# Patient Record
Sex: Female | Born: 1955
Health system: Southern US, Community
[De-identification: ages and names within clinical notes are randomized; demographics above are authoritative.]

## PROBLEM LIST (undated history)

## (undated) DIAGNOSIS — I10 Essential (primary) hypertension: Secondary | ICD-10-CM

## (undated) DIAGNOSIS — G629 Polyneuropathy, unspecified: Secondary | ICD-10-CM

## (undated) DIAGNOSIS — E7800 Pure hypercholesterolemia, unspecified: Secondary | ICD-10-CM

## (undated) DIAGNOSIS — M797 Fibromyalgia: Secondary | ICD-10-CM

## (undated) DIAGNOSIS — K219 Gastro-esophageal reflux disease without esophagitis: Secondary | ICD-10-CM

## (undated) DIAGNOSIS — I639 Cerebral infarction, unspecified: Secondary | ICD-10-CM

## (undated) DIAGNOSIS — F419 Anxiety disorder, unspecified: Secondary | ICD-10-CM

## (undated) DIAGNOSIS — E119 Type 2 diabetes mellitus without complications: Secondary | ICD-10-CM

## (undated) DIAGNOSIS — E039 Hypothyroidism, unspecified: Secondary | ICD-10-CM

## (undated) HISTORY — PX: ABDOMINAL HYSTERECTOMY: SHX81

## (undated) HISTORY — DX: Anxiety disorder, unspecified: F41.9

## (undated) HISTORY — DX: Type 2 diabetes mellitus without complications: E11.9

## (undated) HISTORY — PX: APPENDECTOMY: SHX54

## (undated) HISTORY — DX: Fibromyalgia: M79.7

## (undated) HISTORY — PX: TUBAL LIGATION: SHX77

## (undated) HISTORY — DX: Cerebral infarction, unspecified: I63.9

## (undated) HISTORY — PX: TONSILLECTOMY: SUR1361

## (undated) HISTORY — PX: CHOLECYSTECTOMY: SHX55

## (undated) HISTORY — DX: Hypothyroidism, unspecified: E03.9

---

## 2005-12-01 ENCOUNTER — Ambulatory Visit: Payer: Self-pay | Admitting: Endocrinology

## 2006-05-04 ENCOUNTER — Ambulatory Visit: Payer: Self-pay | Admitting: Endocrinology

## 2006-06-03 ENCOUNTER — Ambulatory Visit: Payer: Self-pay | Admitting: Endocrinology

## 2006-06-03 LAB — CONVERTED CEMR LAB: TSH: 5.23 microintl units/mL (ref 0.35–5.50)

## 2006-10-21 ENCOUNTER — Encounter: Payer: Self-pay | Admitting: Endocrinology

## 2006-10-21 DIAGNOSIS — F411 Generalized anxiety disorder: Secondary | ICD-10-CM | POA: Insufficient documentation

## 2006-10-21 DIAGNOSIS — F329 Major depressive disorder, single episode, unspecified: Secondary | ICD-10-CM | POA: Insufficient documentation

## 2006-10-21 DIAGNOSIS — I10 Essential (primary) hypertension: Secondary | ICD-10-CM | POA: Insufficient documentation

## 2006-11-14 ENCOUNTER — Encounter: Payer: Self-pay | Admitting: Endocrinology

## 2006-11-14 ENCOUNTER — Ambulatory Visit: Payer: Self-pay | Admitting: Endocrinology

## 2006-11-14 LAB — CONVERTED CEMR LAB
Hgb A1c MFr Bld: 6 % (ref 4.6–6.0)
TSH: 2.6 microintl units/mL (ref 0.35–5.50)

## 2006-12-06 ENCOUNTER — Ambulatory Visit: Payer: Self-pay | Admitting: Cardiology

## 2006-12-16 ENCOUNTER — Ambulatory Visit: Payer: Self-pay | Admitting: Cardiology

## 2007-01-03 ENCOUNTER — Ambulatory Visit: Payer: Self-pay | Admitting: Cardiology

## 2007-01-06 ENCOUNTER — Ambulatory Visit: Payer: Self-pay | Admitting: Cardiology

## 2007-01-06 ENCOUNTER — Inpatient Hospital Stay (HOSPITAL_BASED_OUTPATIENT_CLINIC_OR_DEPARTMENT_OTHER): Admission: RE | Admit: 2007-01-06 | Discharge: 2007-01-06 | Payer: Self-pay | Admitting: Cardiology

## 2007-01-23 ENCOUNTER — Ambulatory Visit: Payer: Self-pay | Admitting: Cardiology

## 2008-01-25 ENCOUNTER — Ambulatory Visit: Payer: Self-pay | Admitting: Endocrinology

## 2008-01-25 DIAGNOSIS — E7800 Pure hypercholesterolemia, unspecified: Secondary | ICD-10-CM | POA: Insufficient documentation

## 2008-01-25 DIAGNOSIS — IMO0001 Reserved for inherently not codable concepts without codable children: Secondary | ICD-10-CM | POA: Insufficient documentation

## 2008-01-25 DIAGNOSIS — R7309 Other abnormal glucose: Secondary | ICD-10-CM | POA: Insufficient documentation

## 2008-01-25 DIAGNOSIS — E890 Postprocedural hypothyroidism: Secondary | ICD-10-CM | POA: Insufficient documentation

## 2008-01-25 LAB — CONVERTED CEMR LAB
Hgb A1c MFr Bld: 7.7 % — ABNORMAL HIGH (ref 4.6–6.0)
TSH: 6.61 microintl units/mL — ABNORMAL HIGH (ref 0.35–5.50)

## 2008-01-29 ENCOUNTER — Telehealth (INDEPENDENT_AMBULATORY_CARE_PROVIDER_SITE_OTHER): Payer: Self-pay | Admitting: *Deleted

## 2008-02-02 ENCOUNTER — Telehealth (INDEPENDENT_AMBULATORY_CARE_PROVIDER_SITE_OTHER): Payer: Self-pay | Admitting: *Deleted

## 2008-07-30 ENCOUNTER — Ambulatory Visit (HOSPITAL_BASED_OUTPATIENT_CLINIC_OR_DEPARTMENT_OTHER): Admission: RE | Admit: 2008-07-30 | Discharge: 2008-07-30 | Payer: Self-pay | Admitting: Orthopedic Surgery

## 2010-04-07 NOTE — Assessment & Plan Note (Signed)
   Vital Signs:  Patient Profile:   55 Years Old Female Weight:      183 pounds Temp:     96.9 degrees F Pulse rate:   81 / minute BP sitting:   119 / 85                 Visit Type:  f/u Referred by:  hasanj PCP:  hasanj  Chief Complaint:  hypothyroid.  History of Present Illness: hypothyroidism since i-131 rx 2006.  on synthroid 125/day, feels tired pt also as h/o hyperglycemia--working hard on her diet  Current Allergies: No known allergies   Past Medical History:    Anxiety    Depression    Hypertension    Hypothyroidism due to 131-i 2006    Dyslipidemia    Migraine    Menopause     Review of Systems  The patient denies weight loss.         few lbs   Physical Exam  General:     cooperative to examination and overweight-appearing.   Neck:     no thyromegaly.   Skin:     no edema.   Additional Exam:     tsh=2.6 a1c=6.0    Impression & Recommendations: well-controlled hypothyroidism fatigue of non-thyroidal etiology hyperglycemia or dm  Complete Medication List: 1)  Sertraline Hcl 50 Mg Tabs (Sertraline hcl) .... Take 1 by mouth qd 2)  Zanaflex 4 Mg Caps (Tizanidine hcl) .... Take 1 by mouth qhs 3)  Bisoprolol-hydrochlorothiazide 2.5-6.25 Mg Tabs (Bisoprolol-hydrochlorothiazide) .... Take 1 by mouth qd 4)  Enalapril Maleate 5 Mg Tabs (Enalapril maleate) .... Take 1 by mouth qd 5)  Frova 2.5 Mg Tabs (Frovatriptan succinate) .... Take 1 by mouth once daily prn 6)  Metoclopramide Hcl 10 Mg Tabs (Metoclopramide hcl) .... Take 1 by mouth qid 7)  Alprazolam 0.5 Mg Tabs (Alprazolam) .... Take 1 by mouth three times a day qd 8)  Topamax 50 Mg Tabs (Topiramate) .... Take 1 by mouth two times a day qd 9)  Vitamin B-12 Cr 1000 Mcg Tbcr (Cyanocobalamin) .... Take 1injection q month 10)  Levothyroxine Sodium 125 Mcg Tabs (levothyroxine Sodium)  .... Take 1 by mouth qd 11)  Premarin 1.25 Mg Tabs (Estrogens conjugated) .... Take 1 by mouth qd 12)   Crestor 10 Mg Tabs (Rosuvastatin calcium) .... Take 1 by mouth qd 13)  Nexium 40 Mg Cpdr (Esomeprazole magnesium) .... Take 1 by mouth qd 14)  Phentermine Hcl 37.5 Mg Tabs (Phentermine hcl) .... Take 1 by mouth qd 15)  Duoneb 2.5-0.5 Mg/44ml Soln (Albuterol-ipratropium) .... Take 1 q 4 hours prn   Patient Instructions: 1)  same synthroid (125/d) 2)  redouble diet efforts 3)  ret 6 mos

## 2010-04-07 NOTE — Progress Notes (Signed)
  Phone Note Call from Patient Call back at Home Phone [phone]   Caller: Patient Call For: dr ellsion Summary of Call: per pt call heard  her lab results on phone tree dr ellsion wanted  the name of her pharmacy pt uses [facility]* (retail) [address] [place], [place]  [address] Ph: [phone] Fax: [phone] Initial call taken by: Shelbie Proctor,  January 29, 2008 9:41 AM  Follow-up for Phone Call        Sent new sythroid to pharm. Pls let pt know. Follow-up by: Orlan Leavens,  January 29, 2008 9:55 AM  Additional Follow-up for Phone Call Additional follow up Details #1::        called pt to inform pt made aware rx was sent  Additional Follow-up by: Shelbie Proctor,  January 29, 2008 11:03 AM    New/Updated Medications: LEVOTHYROXINE SODIUM 150 MCG TABS (LEVOTHYROXINE SODIUM) take 51m by mouth qd   Prescriptions: LEVOTHYROXINE SODIUM 150 MCG TABS (LEVOTHYROXINE SODIUM) take 9m by mouth qd  #30 x 6   Entered by:   Orlan Leavens   Authorized by:   Minus Breeding MD   Signed by:   Orlan Leavens on 01/29/2008   Method used:   Electronically to        [facility]* (retail)       [address]       [place], [place]  [address]       Ph: [phone]       Fax: [phone]   RxID:   [id number]

## 2010-04-07 NOTE — Progress Notes (Signed)
Summary: phone tree  Phone Note Call from Patient Call back at Home Phone [phone]   Summary of Call: pt called stated on the phone tree you said you were going to give her something for her sugar and something for her thyroid but only 1 medication was sent to the pharmacy.....so pt is wondering did you want her to be taking second med as you stated on phone tree message Initial call taken by: Windell Norfolk,  February 02, 2008 1:52 PM  Follow-up for Phone Call        today i sent metformin, for sugar Follow-up by: Minus Breeding MD,  February 04, 2008 10:45 AM  Additional Follow-up for Phone Call Additional follow up Details #1::        called pt to inform pt made aware Additional Follow-up by: Shelbie Proctor,  February 05, 2008 8:34 AM      Prescriptions: METFORMIN HCL 500 MG XR24H-TAB (METFORMIN HCL) qam  #30 x 11   Entered and Authorized by:   Minus Breeding MD   Signed by:   Minus Breeding MD on 02/04/2008   Method used:   Electronically to        [facility]* (retail)       [address]       [place], [place]  [address]       Ph: [phone]       Fax: [phone]   RxID:   [id number]

## 2010-06-16 LAB — BASIC METABOLIC PANEL
BUN: 4 mg/dL — ABNORMAL LOW (ref 6–23)
CO2: 27 mEq/L (ref 19–32)
Calcium: 9.1 mg/dL (ref 8.4–10.5)
Chloride: 105 mEq/L (ref 96–112)
Creatinine, Ser: 0.53 mg/dL (ref 0.4–1.2)
GFR calc Af Amer: >60 mL/min (ref >60)
GFR calc non Af Amer: >60 mL/min (ref >60)
Glucose, Bld: 119 mg/dL — ABNORMAL HIGH (ref 70–99)
Potassium: 4.4 mEq/L (ref 3.5–5.1)
Sodium: 138 mEq/L (ref 135–145)

## 2010-06-16 LAB — GLUCOSE, CAPILLARY: Glucose-Capillary: 146 mg/dL — ABNORMAL HIGH (ref 70–99)

## 2010-06-16 LAB — POCT HEMOGLOBIN-HEMACUE: Hemoglobin: 14 g/dL (ref 12.0–15.0)

## 2010-07-21 NOTE — Assessment & Plan Note (Signed)
[facility]                          [facility] OFFICE NOTE   Amber, Hawkins                         MRN:          [id number]  DATE:01/03/2007                            DOB:          1956/02/16    PRIMARY CARDIOLOGIST:  Amber Codding, MD   REASON FOR VISIT:  Post-hospital follow-up.   Amber Hawkins presents to the clinic for post-hospital follow-up after she  was referred to [place] in early October for evaluation of chest pain.  She  presented with no prior history of heart disease, but did have several  cardiac risk factors, and presented with symptoms which we felt were  atypical.  Serial cardiac markers were Hawkins within normal limits.  She  was thus referred for extensive noninvasive workup consisting of 2-D  echo, adenosine stress Cardiolite, as well as carotid Dopplers.  Both  the echo and the adenosine stress Cardiolite were within normal limits.  Carotid Dopplers were for further assessment of a right carotid bruit;  however, the Doppler study suggested less than 50% bilateral ICA  stenosis.   Blood work was notable for an elevated TSH of 10.25.  The patient does  have treated hypothyroidism and is followed by Amber Signs A. Everardo All, MD, in  [place].   After the initial hospitalization, the patient was then briefly  hospitalized 3 weeks later with renal colic.  CT scan did show evidence  of a left ureterovesical junction stone and the patient apparently did  pass a kidney stone.  She is scheduled to follow up with Amber Hawkins.   The patient was also discharged with arrangements for a follow-up  CardioNet monitor for further evaluation of palpitations.  This was  placed several weeks ago and thus far yields evidence only of normal  sinus rhythm with no obvious dysrhythmia.   Clinically, the patient remains a somewhat vague historian.  She does  not describe any frank chest pain and also notes no significant change  in the frequency of her  palpitations.  Of note, we adjusted her  medications with discontinuation of bisoprolol/HCT, placing her only on  pure bisoprolol.   PAST MEDICAL HISTORY:  1. Hypertension.  2. Hyperlipidemia.  3. Hypothyroidism.  4. Gastroesophageal reflux disease.  5. Depression.  6. History of shingles.  7. Status post cholecystectomy.  8. Status post appendectomy.   CURRENT MEDICATIONS:  1. Aspirin 81 mg daily.  2. Bisoprolol 5 mg daily.  3. Nexium 40 mg daily.  4. Avapro 5 mg daily.  5. Synthroid 0.15 mg daily.  6. Topamax 200 mg b.i.d.  7. Crestor 10 mg daily.  8. Xanax 0.5 mg t.i.d.  9. Tizanide 4 mg q.h.s.  10.Sertraline 50 mg daily.  11.Metoclopramide 10 mg q.i.d.  12.Fenofibrate 160 mg daily.   ALLERGIES:  No known drug allergies.   SOCIAL HISTORY:  The patient lives in [place] with her husband.  She  has one child.  She is unemployed.  She has not smoked in 3 years but  previously smoked up to 2 packs a day since the age of 32.  She drinks  alcohol only on rare occasions.   FAMILY HISTORY:  Her father died at age 41 of complications of fatal  myocardial infarction.  He, significantly, reportedly had an MI in his  30s.  Mother is 39, no known heart disease.  The patient has one sister,  who has hypertension.   REVIEW OF SYSTEMS:  The patient denies a history of myocardial  infarction, congestive heart failure, or stroke.  Denies a history of  diabetes mellitus.  She has occasional exertional angina pectoris,  dyspnea, orthopnea, PND, and lower extremity edema.  She also has  symptoms of reflux disease.  Otherwise as per HPI.  Remaining systems  negative.   Electrocardiogram today reveals NSR at 78 BPM with chronic right bundle  branch block.   PHYSICAL EXAMINATION:  Blood pressure 100/74, pulse 88, regular, weight  186.4.  GENERAL:  A 55 year old female, morbidly obese, sitting upright in no  distress.  HEENT:  Normocephalic, atraumatic.  NECK:  Palpable bilateral  carotid pulses.  Right carotid bruit, no bruit  on the left.  No JVD.  LUNGS:  Clear to auscultation in Hawkins fields.  HEART:  Regular rate and rhythm, positive S1-S2.  No murmurs, rubs or  gallops.  ABDOMEN:  Protuberant, nontender.  EXTREMITIES:  No significant edema.  NEUROLOGIC:  Flat affect but no focal deficit.   IMPRESSION:  1. Atypical chest pain.      a.     Recent extensive workup with normal adenosine Cardiolite and       normal 2-D echo.  2. Palpitations.      a.     No dysrhythmia by CardioNet monitoring.  3. Treated hypothyroidism.      a.     Mildly elevated TSH of 10.3.  4. Hypotension.  5. Right bundle branch block.  6. History of tobacco.  7. Right carotid bruit.      a.     No significant internal carotid artery stenosis by recent       carotid Dopplers.   PLAN:  1. Complete full 3-week evaluation with CardioNet monitoring.  2. Recommend discontinuation of ACE inhibitor in light of current      hypotension, and given that the patient has normal left ventricular      function and no evidence of diabetes mellitus.  She is also on      bisoprolol, which we recently placed her on, both for treatment of      hypertension as well as palpitations.  3. Upon further review with Dr. Andee Hawkins, and in compliance with the      wishes of both the patient and her husband who is quite concerned      about her ongoing symptoms, the recommendation is to proceed with a      diagnostic coronary angiogram.  This is to further evaluate poor      exercise tolerance in light of her recent stress test, during which      she was initially asked to walk but apparently was then unable to      complete this.  The patient is agreeable with this plan and the      risks/benefits were discussed, in conjunction with Dr. Andee Hawkins.      Amber Serpe, PA-C  Electronically Signed      Amber Codding, MD,FACC  Electronically Signed   GS/MedQ  DD: 01/03/2007  DT: 01/04/2007  Job #: [id number]    cc:   Amber Hawkins

## 2010-07-21 NOTE — Op Note (Signed)
NAMERICKY, DOAN                ACCOUNT NO.:  [id number]   MEDICAL RECORD NO.:  [id number]          PATIENT TYPE:  AMB   LOCATION:  DSC                          FACILITY:  [facility]   PHYSICIAN:  Katy Fitch. Sypher, M.D. DATE OF BIRTH:  1955/05/31   DATE OF PROCEDURE:  07/30/2008  DATE OF DISCHARGE:                               OPERATIVE REPORT   PREOPERATIVE DIAGNOSIS:  Severe McGowan grade 3 ulnar neuropathy at left  elbow with positive electrodiagnostic studies documenting significant  ulnar neuropathy.   POSTOPERATIVE DIAGNOSIS:  Severe McGowan grade 3 ulnar neuropathy at  left elbow with positive electrodiagnostic studies documenting  significant ulnar neuropathy with identification of severe compression  of ulnar nerve at arcade of Struthers proximal to cubital tunnel.   OPERATION:  In situ decompression of ulnar nerve from arcade of  Struthers distally through the cubital tunnel, 6 cm distal to the  epicondyle.   SURGEON:  Katy Fitch. Sypher, MD   ASSISTANT:  Annye Rusk, PA-C   ANESTHESIA:  General by LMA.   SUPERVISING ANESTHESIOLOGIST:  Bedelia Person, MD   INDICATIONS:  Amber Hawkins is a 55 year old woman referred through the  courtesy of Dr. Doreen Beam of [place], [place], for evaluation and  management of left hand weakness and numbness.   Clinical examination revealed signs of severe left ulnar neuropathy with  intrinsic atrophy, a positive Froment sign, weak intrinsic function.  Electrodiagnostic studies were obtained at the office confirming very  severe left ulnar neuropathy in the elbow region.   We advised Amber Hawkins to proceed with exploration of the ulnar nerve  anticipating possible transposition.  We pointed out that this is an  orderly decompression surgery that will evaluate each of the possible  compression sites and treated accordingly.   If she is noted to have an unstable ulnar nerve, anterior transposition  would be indicated.   After  informed consent, she was brought to the operating room at this  time.   Preoperatively, she was advised that would take anywhere from several  months to as long as 20 months to see the full benefits of surgery.  Given her background history of multiple medical problems, she may have  slower healing than a more healthy individual   Questions were invited and answered in detail.   PROCEDURE:  Amber Hawkins was brought to the operating room and placed in  supine position upon on the table.   Following the induction of general anesthesia by LMA technique, the left  arm was prepped with Betadine soap solution and sterilely draped.  A  pneumatic tourniquet was applied to the proximal left brachium.   Following exsanguination of left arm with Esmarch bandage, the arterial  tourniquet was inflated to 220 mmHg.   Procedure commenced with a 2-cm incision directly over the ulnar nerve  posterior to the epicondyle.  Subcutaneous tissues were carefully  divided revealing considerable adipose tissue.  The anatomic landmarks  were identified followed by isolation of the ulnar nerve behind the  epicondyle.  The arcuate ligament was released followed by release of  the fascia  at the heads of the flexor carpi radialis.  Subcutaneous  bands of fascia were released and the nerve was decompressed from the  epicondyle 6 cm distally.  The nerve was stable with range of motion.  Proximal dissection revealed quite a bit of scarring and edema  surrounding the ulnar nerve at the arcade of Struthers.   Due to a very mesomorphic body habitus, we were challenged to visualize  this region despite maximum retraction efforts.  Therefore, the incision  was extended 3 cm proximally for direct visualization of the arcade of  Struthers.   There was extreme tension on the nerve and edema of the nerve at the  level of the arcade.  The vascular structures were carefully protected  while the fascial structures were  released.  The arcade of Struthers was  completely excised followed by release of the fascia overlying the nerve  and additional 3 cm above the elbow for decompression of at least 10 cm.   Bleeding points were electrocauterized with bipolar current followed by  inspection of the nerve during full range of motion of the elbow.  The  nerve remained stable in the cubital groove.   It did not appear that anterior transposition would be indicated.   The wound was then irrigated followed by repair of the skin with  subcutaneous suture of 3-0 Vicryl and intradermal 3-0 Prolene.   The wound was dressed with sterile gauze, a Tegaderm, and an Ace wrap.   Amber Hawkins will be advised to begin immediate active range of motion  exercises.   We will see her back for followup in our office in 1 week for wound  check and remove her sutures in 2 weeks.   For aftercare, she was provided a prescription for Percocet 5 mg one  p.o. 4-6 h. p.r.n. pain 20 tablets without refill.   She will contact our office p.r.n. with any problems with her dressing,  her medication, or any concerns about her wound.      Katy Fitch Sypher, M.D.  Electronically Signed     RVS/MEDQ  D:  07/30/2008  T:  07/30/2008  Job:  [id number]

## 2010-07-21 NOTE — Cardiovascular Report (Signed)
Amber Hawkins, Amber Hawkins                ACCOUNT NO.:  [id number]   MEDICAL RECORD NO.:  [id number]          PATIENT TYPE:  OIB   LOCATION:  NA                           FACILITY:  [facility]   PHYSICIAN:  Bruce R. Juanda Chance, MD, FACCDATE OF BIRTH:  07-Feb-1956   DATE OF PROCEDURE:  01/06/2007  DATE OF DISCHARGE:                            CARDIAC CATHETERIZATION   CLINICAL HISTORY:  Ms. Butkus is 55 years old and was recently  hospitalized for chest pain and was ruled out for myocardial infarction  and subsequently had a Myoview scan and an echocardiogram which were  negative.  She did have some 40-60% stenosis in her right carotid artery  by duplex.  Because of persistent chest pain, Dr. Andee Lineman and Gene Serpe  felt she should be evaluated further with angiography and this was  scheduled today.   DESCRIPTION OF PROCEDURE:  The procedure was performed via the right  femoral artery using an arterial sheath and 4-French preformed coronary  catheters.  A front wall arterial puncture was formed and Omnipaque  contrast was used.  The patient tolerated the procedure well and left  the laboratory in satisfactory condition.   RESULTS:  The aortic pressure was 128/71 the mean of 95 and left index  pressure was 128/11.   LEFT MAIN CORONARY ARTERY:  The left main coronary artery was free of  significant disease.   LEFT ANTERIOR DESCENDING ARTERY:  The left anterior descending artery  gave rise to three septal perforators and two diagonal branches.  The  study was slightly irregular and there was 30% narrowing in the mid LAD  after the second diagonal branch.   CIRCUMFLEX ARTERY:  The circumflex artery gave rise to a ramus branch, a  marginal branch and a posterolateral branch and an atrial branch. These  vessels were free of significant disease.   RIGHT CORONARY ARTERY:  The right coronary artery was a moderate-sized  vessel that gave rise to a conus branch, right ventricular branch,  posterior  descending branch and three posterolateral branches.  These  vessels were free of significant disease.   LEFT VENTRICULOGRAM:  The left ventriculogram in the RAO projection  showed good wall motion with no areas of hypokinesis.  The estimated  ejection fraction was 60%.   CONCLUSION:  Minimal nonobstructive coronary artery disease with  irregularities in the LAD and 30% narrowing in the mid LAD and no  significant obstruction of the circumflex and right coronary arteries  are normal LV function.   RECOMMENDATIONS:  Reassurance.  Will arrange follow-up with Dr. Andee Lineman  and Gene Serpe in 2 weeks to decide about any further evaluation.      Bruce Elvera Lennox Juanda Chance, MD, [facility]  Electronically Signed     BRB/MEDQ  D:  01/06/2007  T:  01/06/2007  Job:  [id number]   cc:   Learta Codding, MD,FACC  Lia Hopping

## 2010-07-21 NOTE — Assessment & Plan Note (Signed)
[facility]                          [facility]ICE NOTE   Amber Hawkins, Amber Hawkins                         MRN:          [id number]  DATE:01/23/2007                            DOB:          04-26-1955    CARDIOLOGIST:  Learta Codding, M.D.   PRIMARY CARE PHYSICIAN:  Lia Hopping, M.D.   REASON FOR VISIT:  Post catheterization follow-up.   HISTORY OF PRESENT ILLNESS:  Amber Hawkins is a very pleasant 55 year old  female patient with history of hypertension, hyperlipidemia, and  hypothyroidism, who was recently evaluated by our group for chest pain.  She had had an extensive evaluation including a 2-D echocardiogram which  revealed normal LV function and an adenosine Cardiolite which was  normal.  She also had bilateral carotid Dopplers which revealed less  than 50% bilateral ICA stenosis.  The patient noted decreased exercise  tolerance and concerning symptoms and it was eventually decided to go  ahead and proceed with cardiac catheterization.  This was performed by  Dr. Juanda Chance on January 06, 2007.  Her EF was normal and she had minimal  coronary plaquing.  She had a 30% stenosis in the mid LAD.  Her  circumflex and RCA had no significant disease.  She was reassured and  returns to our office today for follow-up.   Since being seen for her cardiac catheterization she has seen a  chiropractor in [place] who has diagnosed her with fibromyalgia.  She  is going through some treatment with him and is doing about the same.  She notes a constant ache pretty much all over her entire body.  Her  chest pain is fairly constant and never really goes away.  She is short  of breath with exertion.  She is unsure whether or not she is more short  of breath from experiencing pain or if she is truly short of breath.  She describes NYHA class II-B symptoms.  She denies orthopnea but does  have occasional PND.  She has mild pedal edema.  These symptoms have  been  ongoing for the last several years without much change.  Of note  she does describe some dysphagia with pills as well as odynophagia with  pills.  She denies any melena.  She does note a globus sensation.  She  is already on Nexium for acid reflux disease.   CURRENT MEDICATIONS:  1. Synthroid 125 mcg daily.  2. Metoclopramide 10 mg daily.  3. Fenofibrate 160 mg daily.  4. Bisoprolol 5 mg daily.  5. Aspirin 81 mg daily.  6. Nexium 40 mg daily.  7. Enalapril 5 mg daily.  8. Topamax 100 mg b.i.d.  9. Crestor 10 mg daily.  10.Xanax 0.5 mg three times a day.  11.Tizanidine 4 mg at bedtime.  12.Sertraline 50 mg daily.  13.B12.  14.Fiber pack.  15.5HTP 100 mg.  16.Digestive enzymes.   PHYSICAL EXAMINATION:  She is a well-nourished, well-developed female.  She has blood pressure of 110/73, pulse 78, weight 186.6 pounds.  HEENT:  Normal.  NECK: Without JVD.Marland Kitchen  CARDIOVASCULAR:  Normal S1/S2.  Regular rate and rhythm without murmur.  LUNGS:  Clear to auscultation bilaterally.  ABDOMEN:  Soft, nontender.  EXTREMITIES:  Without edema.  Right femoral arteriotomy site without  hematoma or bruit.   Electrocardiogram reveals sinus rhythm with a heart rate of 81, normal  axis, right bundle branch block.  No significant changes from previous  tracings.   LABORATORY DATA:  The report from her CardioNet monitor indicates that  she mainly had sinus rhythm and one episode of sinus tachycardia.  No  significant arrhythmias or pauses.   IMPRESSION:  1. Chronic chest pain, noncardiac.  2. Dyspnea - probably multifactorial.      a.     Suspect deconditioning from fibromyalgia.  3. Minimal coronary plaquing by recent catheterization.  4. Good LV function.  5. History of carotid bruit.      a.     Less than 50% bilateral ICA stenosis.  6. Hypertension.  7. Hyperlipidemia, treated.  8. Hypothyroidism, treated.      a.     Followed by Dr. Everardo All in [place].  9. Gastroesophageal reflux  disease.      a.     Recent history of dysphagia and odynophagia.  10.Depression.  11.Recent diagnosis of fibromyalgia.  12.Palpitations.      a.     Negative workup recently.   PLAN:  Amber Hawkins returns to the office today for follow-up.  Her cardiac  catheterization as noted above revealed essentially normal coronary  artery disease for 30% stenosis in LAD.  Continued risk factor  modification is recommended.  She will continue follow-up at primary  care physician as well as her endocrinologist.  She does have  significant symptoms of dysphagia and  odynophagia.  At this point in time I have recommended a referral to  gastroenterology.  We can see her back in this office in six months or  sooner p.r.n.      Tereso Newcomer, PA-C  Electronically Signed      Learta Codding, MD,FACC  Electronically Signed   SW/MedQ  DD: 01/23/2007  DT: 01/24/2007  Job #: [id number]   cc:   Lia Hopping

## 2010-07-24 NOTE — Consult Note (Signed)
[facility] HEALTHCARE                          ENDOCRINOLOGY CONSULTATION   Amber Hawkins                         MRN:          [id number]  DATE:05/04/2006                            DOB:          1955/03/15    REASON FOR VISIT:  Follow up thyroid.   HISTORY OF PRESENT ILLNESS:  The patient is a 55 year old woman who, for  her hypothyroidism, takes Synthroid 150 micrograms a day.  However, she  states that she takes this medication on some days, but not others.  Symptomatically, she has some symptoms of fatigue, insomnia and  depression.  She states she saw Dr. Olena Leatherwood on January 8.  He advised  her to decrease it to 125 micrograms a day, but she did not want to make  the decrease.   SOCIAL HISTORY:  Her husband has been diagnosed with cancer and she is  having to care for him.   REVIEW OF SYSTEMS:  Denies any change in her weight.   PHYSICAL EXAM:  Blood pressure 132/86, heart rate 103, temperature is  97.9, the weight is 186.  GENERAL:  No distress.  She does not appear depressed at the time of her  visit.  The thyroid is slightly and diffusely enlarged.   IMPRESSION:  1. Hypothyroidism.  2. Nonadherence with therapy.   PLAN:  1. Decrease Synthroid to 125 micrograms a day, as advised by Dr.      Olena Leatherwood.  2. Return here in a month.     Sean A. Everardo All, MD  Electronically Signed    SAE/MedQ  DD: 05/06/2006  DT: 05/06/2006  Job #: [id number]   cc:   Amber Hawkins

## 2010-07-24 NOTE — Consult Note (Signed)
[facility]ALTHCARE                            ENDOCRINOLOGY CONSULTATION   Amber Hawkins                         MRN:          [id number]  DATE:12/01/2005                            DOB:          1955/06/25    REFERRING PHYSICIAN:  Annette Stable Hawkins   REASON FOR REFERRAL:  Diabetes and thyroid problems.   HISTORY OF THE PRESENT ILLNESS:  A 56 year old woman who states that she has  become concerned, believes she may have diabetes.  She describes her  exercise as none and her diet is getting better.  She had an oral  glucose tolerance test, the 2-hour glucose of which was 284.  However, she  is uncertain how many grams of glucose were given. She states she went to a  doctor who did a hemoglobin A1C on a rapid kit and found it to be 7.0.  She  states she has also been told that she does not have diabetes.   She also has a 1-year history of hypothyroidism for which she takes  Synthroid 150 mcg daily.  She states Dr. Olena Hawkins increased this from 125 mcg  daily several months ago.   Symptomatically, she has several months of severe fatigue with 60-pound  associated weight gain.  She states she has become very discouraged about  her weight in general.   PAST MEDICAL HISTORY:  1. Hypertension.  2. Dyslipidemia.  3. GERD.  4. Migraine.  5. Depression/anxiety.  6. Menopause.   SOCIAL HISTORY:  She is married.  She does not work outside the home.   FAMILY HISTORY:  Her father has diabetes.  She is not aware of any thyroid  problems in her immediate family.   REVIEW OF SYSTEMS:  She has some blurry vision but feels this is due to  Bell's palsy she had recently.  She also has some urinary frequency.   PHYSICAL EXAMINATION:  VITAL SIGNS:  Blood pressure is 121/77.  Heart rate  93.  Temperature 97.8.  Weight 185.  GENERAL:  An obese woman in no distress.  SKIN:  Not diaphoretic.  HEENT:  No proptosis.  No periorbital swelling.  Pharynx normal.  NECK:   The thyroid is minimally and diffusely enlarged.  CHEST:  Clear to auscultation.  CARDIOVASCULAR:  No JVD.  There is trace bilateral pretibial edema.  Regular  rate and rhythm.  No murmur.  Pedal pulses are intact and she has no bruit  at the carotid arteries.  FEET:  Normal color and temperature. There is no ulcer present on the feet.  NEUROLOGIC:  Alert and oriented.  She does not appear anxious nor depressed  and sensation is intact to touch on the feet.   LABORATORY STUDIES:  On 12/01/05, hemoglobin A1C 5.7, TSH 0.67.   IMPRESSION:  1. It is uncertain whether or not she has diabetes.  If she does not have      diabetes, she has hyperglycemia and has a high risk of developing      diabetes soon.  2. Well-controlled hypothyroidism.  3. Symptom complex of nonthyroidal etiology.   PLAN:  1. Upon receipt of these laboratory results, I left the patient a message      stating that she does in fact have hypothyroidism, and Dr. Olena Hawkins is      prescribing her the proper amount of Synthroid.  2. In my message also, I had mentioned to her that the hemoglobin A1C is      not an official criterion for diabetes yet (although it probably will      be accepted as so in the future).  I have told her that at this level      of hemoglobin A1C, some specialists recommend medication and others do      not.  3. I have also included in my message that at any rate, she must redouble      her dietary efforts to minimize the possibility of progression of her      glucose.  4. I have also left her a message saying that I see no problem with the      care Dr. Olena Hawkins has given her and I would do the same thing as he is      doing.  I have told her that she can follow up with Dr. Olena Hawkins.  I      told her I am happy to see her in the future if Dr. Olena Hawkins should so      request.            ______________________________  Amber Hawkins. Amber All, MD     SAE/MedQ  DD:  12/02/2005  DT:  12/04/2005  Job #:   [id number]   cc:   Amber Hawkins

## 2010-07-24 NOTE — Consult Note (Signed)
[facility] HEALTHCARE                          ENDOCRINOLOGY CONSULTATION   Amber Hawkins, Amber Hawkins                         MRN:          [id number]  DATE:06/03/2006                            DOB:          1955/11/23    REASON FOR VISIT:  Follow up thyroid history.   HISTORY OF PRESENT ILLNESS:  This is a 55 year old woman who is on 125  mcg daily Synthroid.  She feels well and no different.   Regarding her hyperglycemia, she states she is working on her diet.   SOCIAL HISTORY:  Her husband's health has improved recently.   REVIEW OF SYSTEMS:  She has lost several pounds recently.   PHYSICAL EXAMINATION:  VITAL SIGNS:  Blood pressure 105/68, heart rate  84, temperature 98.5, weight 184.  GENERAL:  No distress.  NECK:  Thyroid is normal.  NEUROLOGICAL:  Alert and oriented, does not appear anxious nor  depressed, and there is no tremor.   LABORATORY DATA:  TSH 5.23.   IMPRESSION:  Well-controlled hypothyroidism.   PLAN:  1. Same amount of Synthroid.  2. Return in six months.     Sean A. Everardo All, MD  Electronically Signed    SAE/MedQ  DD: 06/06/2006  DT: 06/07/2006  Job #: [id number]   cc:   Lia Hopping

## 2014-07-11 ENCOUNTER — Encounter (INDEPENDENT_AMBULATORY_CARE_PROVIDER_SITE_OTHER): Payer: Self-pay

## 2014-07-11 ENCOUNTER — Encounter (INDEPENDENT_AMBULATORY_CARE_PROVIDER_SITE_OTHER): Payer: Self-pay | Admitting: *Deleted

## 2017-01-18 ENCOUNTER — Encounter: Payer: Self-pay | Admitting: Nurse Practitioner

## 2018-01-30 ENCOUNTER — Ambulatory Visit (INDEPENDENT_AMBULATORY_CARE_PROVIDER_SITE_OTHER): Payer: 59 | Admitting: Otolaryngology

## 2018-01-30 DIAGNOSIS — H6122 Impacted cerumen, left ear: Secondary | ICD-10-CM

## 2018-01-30 DIAGNOSIS — H903 Sensorineural hearing loss, bilateral: Secondary | ICD-10-CM | POA: Diagnosis not present

## 2018-10-02 ENCOUNTER — Encounter: Payer: Self-pay | Admitting: Nurse Practitioner

## 2018-10-10 ENCOUNTER — Other Ambulatory Visit: Payer: Self-pay

## 2018-10-10 ENCOUNTER — Ambulatory Visit (INDEPENDENT_AMBULATORY_CARE_PROVIDER_SITE_OTHER): Payer: 59 | Admitting: Nurse Practitioner

## 2018-10-10 VITALS — BP 111/78 | HR 96 | Temp 98.2°F | Ht 64.0 in | Wt 178.2 lb

## 2018-10-10 DIAGNOSIS — R112 Nausea with vomiting, unspecified: Secondary | ICD-10-CM | POA: Diagnosis not present

## 2018-10-10 DIAGNOSIS — I639 Cerebral infarction, unspecified: Secondary | ICD-10-CM | POA: Insufficient documentation

## 2018-10-10 DIAGNOSIS — R1010 Upper abdominal pain, unspecified: Secondary | ICD-10-CM | POA: Diagnosis not present

## 2018-10-10 NOTE — Patient Instructions (Addendum)
1. Schedule and EGD (upper endoscopy) and colonoscopy with Dr. Laural Golden.  2. Avoid spicy, acidic foods. May take Gaviscon 1 tablespoon twice daily before meals.  3. Call our office if your nausea, vomiting or abdominal pain worsens. I discussed scheduling an abdominal/pelvic CAT scan if your abdominal pain persists or worsens. Call Valina Maes NP  in 1 week with an update   4. I have requested a copy of your most recent laboratory studies from your primary care physician's office  5. Zofran 4mg  one tab by mouth every 6 hours as needed for nausea

## 2018-10-10 NOTE — Progress Notes (Signed)
Subjective:    Patient ID: Amber Hawkins, female    DOB: Aug 26, 1955, 63 y.o.   MRN: [id number]  HPI  Amber Hawkins is a 63 y.o. female with a past medical history DM II, CVA, HTN, hyperlipidemia, anxiety, depression, fibromyalgia, Bell's Palsy, asthma,  hypothyroidism, GERD,IBS, carpal tunnel, chronic neck and back pain. She presents today accompanied by her husband for further evaluation regarding abdominal pain. She is a challenging historian. She complains of having upper abdominal pain that has progressively worsened over the past 6 to 8 months. She has nausea, no vomiting. Her upper abdominal pain worsens shortly after she eats most foods. She develops profuse sweating within 5 to 10 minutes of eating which occurs almost daily. She sometimes feels the pain throughout her abdomen. She reports having a cholecystectomy many years ago. She complains of having loose to watery stools most days for the past 2 years. She passes a solid brown stool once monthly. No rectal bleeding or melena. No weight loss. Mother with history of colon cancer diagnosed in her 21's. Maternal grandfather and 2 or 3 maternal aunts with a history of colon cancer. Her most recent colonoscopy was 07/15/2009 which identified one hyperplastic polyp to the distal sigmoid colon. She takes Meloxicam bid and ASA 325mg  daily. She remains on Protonix 40mg  once daily.   Current Outpatient Medications on File Prior to Visit  Medication Sig Dispense Refill  . aspirin 325 MG tablet Take 325 mg by mouth daily.    Marland Kitchen atorvastatin (LIPITOR) 40 MG tablet Take 40 mg by mouth daily.    Marland Kitchen b complex vitamins capsule Take 1 capsule by mouth daily.    . carvedilol (COREG) 3.125 MG tablet Take 3.125 mg by mouth 2 (two) times daily with a meal.    . cefUROXime (CEFTIN) 500 MG tablet Take 500 mg by mouth 2 (two) times daily with a meal. For 7 days.    . clonazePAM (KLONOPIN) 1 MG tablet Take 1 mg by mouth 3 (three) times daily as needed for anxiety.    .  DULoxetine (CYMBALTA) 20 MG capsule Take 20 mg by mouth daily.    . fenofibrate (TRICOR) 145 MG tablet Take 145 mg by mouth daily.    . folic acid (FOLVITE) 027 MCG tablet Take 400 mcg by mouth daily.    Marland Kitchen gabapentin (NEURONTIN) 300 MG capsule Take 300 mg by mouth 3 (three) times daily.    . Ibuprofen 200 MG CAPS Take 200 mg by mouth as needed.    Marland Kitchen levothyroxine (SYNTHROID) 175 MCG tablet Take 175 mcg by mouth daily before breakfast.    . LISINOPRIL PO Take 10 mg by mouth daily.    Marland Kitchen loratadine (CLARITIN) 10 MG tablet Take 10 mg by mouth daily.    . meloxicam (MOBIC) 7.5 MG tablet Take 7.5 mg by mouth 2 (two) times daily.    . metFORMIN (GLUCOPHAGE) 500 MG tablet Take 500 mg by mouth 2 (two) times daily with a meal.    . pantoprazole (PROTONIX) 40 MG tablet Take 40 mg by mouth daily.    Marland Kitchen venlafaxine (EFFEXOR) 37.5 MG tablet Take 37.5 mg by mouth daily.     No current facility-administered medications on file prior to visit.   No Known Allergies  Social History   Socioeconomic History  . Marital status: Married    Spouse name: Not on file  . Number of children: Not on file  . Years of education: Not on file  . Highest education  level: Not on file  Occupational History  . Not on file  Social Needs  . Financial resource strain: Not on file  . Food insecurity    Worry: Not on file    Inability: Not on file  . Transportation needs    Medical: Not on file    Non-medical: Not on file  Tobacco Use  . Smoking status: Former Research scientist (life sciences)  . Smokeless tobacco: Never Used  Substance and Sexual Activity  . Alcohol use: Not on file  . Drug use: Not on file  . Sexual activity: Not on file  Lifestyle  . Physical activity    Days per week: Not on file    Minutes per session: Not on file  . Stress: Not on file  Relationships  . Social Herbalist on phone: Not on file    Gets together: Not on file    Attends religious service: Not on file    Active member of club or organization:  Not on file    Attends meetings of clubs or organizations: Not on file    Relationship status: Not on file  . Intimate partner violence    Fear of current or ex partner: Not on file    Emotionally abused: Not on file    Physically abused: Not on file    Forced sexual activity: Not on file  Other Topics Concern  . Not on file  Social History Narrative  . Not on file   Review of Systems  See HPI all other systems reviewed and are negative      Objective:   Physical Exam  BP 111/78   Pulse 96   Temp 98.2 F (36.8 C)   Ht 5\' 4"  (1.626 m)   Wt 178 lb 3.2 oz (80.8 kg)   BMI 30.59 kg/m     General: 63 y.o. female well developed in NAD Head: normocephalic Eyes: sclera nonicteric, conjunctiva pink Mouth: no erythema or ulcers Neck: supple, no thyromegaly or lymphadenopathy Heart: RRR, no murmur Lungs: clear throughout Abdomen: epigastric tenderness without rebound or guarding, no HSM, + BS x 4 quads Rectal: deferred Extremities: bilateral extremities with trace edema Neuro: alert and oriented x 4, speech is clear, moves all extremities  Assessment & Plan:   1.  Epigastric pain -schedule EGD and colonoscopy, benefits and risks discussed  -Gaviscon 1 tablespoon by mouth bid PRN -Requested labs done recently by her PCP  2. Chronic diarrhea, last colonoscopy by Dr. Laural Golden was in 2011 no colitis 1 Hyperplastic polyp removed -schedule EGD and colonoscopy  3. Generalized abdominal pain -discussed scheduling an abdominal/pelvic CT if abdominal pain persists or worsens  4. Zofran 4mg  one tab by mouth every 6 hours as needed   5. CVA 2018, previously on Plavix which was stopped   6. DM II  -to hold Metformin evening before and morning of procedures   7. Family history of colon cancer

## 2018-10-11 ENCOUNTER — Telehealth (INDEPENDENT_AMBULATORY_CARE_PROVIDER_SITE_OTHER): Payer: Self-pay | Admitting: *Deleted

## 2018-10-11 ENCOUNTER — Encounter (INDEPENDENT_AMBULATORY_CARE_PROVIDER_SITE_OTHER): Payer: Self-pay | Admitting: *Deleted

## 2018-10-11 ENCOUNTER — Encounter (INDEPENDENT_AMBULATORY_CARE_PROVIDER_SITE_OTHER): Payer: Self-pay | Admitting: Nurse Practitioner

## 2018-10-11 DIAGNOSIS — R109 Unspecified abdominal pain: Secondary | ICD-10-CM | POA: Insufficient documentation

## 2018-10-11 DIAGNOSIS — R1010 Upper abdominal pain, unspecified: Secondary | ICD-10-CM | POA: Insufficient documentation

## 2018-10-11 DIAGNOSIS — R112 Nausea with vomiting, unspecified: Secondary | ICD-10-CM | POA: Insufficient documentation

## 2018-10-11 MED ORDER — ONDANSETRON HCL 4 MG PO TABS: 4.0000 mg | ORAL_TABLET | Freq: Three times a day (TID) | ORAL | 0 refills | Status: DC | PRN

## 2018-10-11 MED ORDER — SUPREP BOWEL PREP KIT 17.5-3.13-1.6 GM/177ML PO SOLN: 1.0000 | Freq: Once | ORAL | 0 refills | Status: AC

## 2018-10-11 NOTE — Telephone Encounter (Signed)
Patient needs suprep 

## 2018-10-16 ENCOUNTER — Telehealth (INDEPENDENT_AMBULATORY_CARE_PROVIDER_SITE_OTHER): Payer: Self-pay | Admitting: *Deleted

## 2018-10-16 NOTE — Patient Instructions (Signed)
Amber Hawkins  10/16/2018     @PREFPERIOPPHARMACY @   Your procedure is scheduled on  10/20/2018  Report to [facility] at  1050  A.M.  Call this number if you have problems the morning of surgery:  [phone]   Remember:  Follow the diet and prep instructions given to you by Dr Olevia Perches office.                       Take these medicines the morning of surgery with A SIP OF WATER   Carvedilol, clonazepam, cymbalta, gabapentin, levothyroxine, mobic(if needed), zofran( if needed), protonix, effexor.    Do not wear jewelry, make-up or nail polish.  Do not wear lotions, powders, or perfumes. Please wear deodorant and brush your teeth.  Do not shave 48 hours prior to surgery.  Men may shave face and neck.  Do not bring valuables to the hospital.  [facility] is not responsible for any belongings or valuables.  Contacts, dentures or bridgework may not be worn into surgery.  Leave your suitcase in the car.  After surgery it may be brought to your room.  For patients admitted to the hospital, discharge time will be determined by your treatment team.  Patients discharged the day of surgery will not be allowed to drive home.   Name and phone number of your driver:   family Special instructions:  None  Please read over the following fact sheets that you were given. Anesthesia Post-op Instructions and Care and Recovery After Surgery       Upper Endoscopy, Adult, Care After This sheet gives you information about how to care for yourself after your procedure. Your health care provider may also give you more specific instructions. If you have problems or questions, contact your health care provider. What can I expect after the procedure? After the procedure, it is common to have:  A sore throat.  Mild stomach pain or discomfort.  Bloating.  Nausea. Follow these instructions at home:   Follow instructions from your health care provider about what to eat or drink after  your procedure.  Return to your normal activities as told by your health care provider. Ask your health care provider what activities are safe for you.  Take over-the-counter and prescription medicines only as told by your health care provider.  Do not drive for 24 hours if you were given a sedative during your procedure.  Keep all follow-up visits as told by your health care provider. This is important. Contact a health care provider if you have:  A sore throat that lasts longer than one day.  Trouble swallowing. Get help right away if:  You vomit blood or your vomit looks like coffee grounds.  You have: ? A fever. ? Bloody, black, or tarry stools. ? A severe sore throat or you cannot swallow. ? Difficulty breathing. ? Severe pain in your chest or abdomen. Summary  After the procedure, it is common to have a sore throat, mild stomach discomfort, bloating, and nausea.  Do not drive for 24 hours if you were given a sedative during the procedure.  Follow instructions from your health care provider about what to eat or drink after your procedure.  Return to your normal activities as told by your health care provider. This information is not intended to replace advice given to you by your health care provider. Make sure you discuss any questions you have with your  health care provider. Document Released: 08/24/2011 Document Revised: 08/16/2017 Document Reviewed: 07/25/2017 Elsevier Patient Education  2020 [employer].  Colonoscopy, Adult, Care After This sheet gives you information about how to care for yourself after your procedure. Your health care provider may also give you more specific instructions. If you have problems or questions, contact your health care provider. What can I expect after the procedure? After the procedure, it is common to have:  A small amount of blood in your stool for 24 hours after the procedure.  Some gas.  Mild abdominal cramping or bloating.  Follow these instructions at home: General instructions  For the first 24 hours after the procedure: ? Do not drive or use machinery. ? Do not sign important documents. ? Do not drink alcohol. ? Do your regular daily activities at a slower pace than normal. ? Eat soft, easy-to-digest foods.  Take over-the-counter or prescription medicines only as told by your health care provider. Relieving cramping and bloating   Try walking around when you have cramps or feel bloated.  Apply heat to your abdomen as told by your health care provider. Use a heat source that your health care provider recommends, such as a moist heat pack or a heating pad. ? Place a towel between your skin and the heat source. ? Leave the heat on for 20-30 minutes. ? Remove the heat if your skin turns bright red. This is especially important if you are unable to feel pain, heat, or cold. You may have a greater risk of getting burned. Eating and drinking   Drink enough fluid to keep your urine pale yellow.  Resume your normal diet as instructed by your health care provider. Avoid heavy or fried foods that are hard to digest.  Avoid drinking alcohol for as long as instructed by your health care provider. Contact a health care provider if:  You have blood in your stool 2-3 days after the procedure. Get help right away if:  You have more than a small spotting of blood in your stool.  You pass large blood clots in your stool.  Your abdomen is swollen.  You have nausea or vomiting.  You have a fever.  You have increasing abdominal pain that is not relieved with medicine. Summary  After the procedure, it is common to have a small amount of blood in your stool. You may also have mild abdominal cramping and bloating.  For the first 24 hours after the procedure, do not drive or use machinery, sign important documents, or drink alcohol.  Contact your health care provider if you have a lot of blood in your stool,  nausea or vomiting, a fever, or increased abdominal pain. This information is not intended to replace advice given to you by your health care provider. Make sure you discuss any questions you have with your health care provider. Document Released: 10/07/2003 Document Revised: 12/15/2016 Document Reviewed: 05/06/2015 Elsevier Patient Education  2020 [place] After These instructions provide you with information about caring for yourself after your procedure. Your health care provider may also give you more specific instructions. Your treatment has been planned according to current medical practices, but problems sometimes occur. Call your health care provider if you have any problems or questions after your procedure. What can I expect after the procedure? After your procedure, you may:  Feel sleepy for several hours.  Feel clumsy and have poor balance for several hours.  Feel forgetful about what happened  after the procedure.  Have poor judgment for several hours.  Feel nauseous or vomit.  Have a sore throat if you had a breathing tube during the procedure. Follow these instructions at home: For at least 24 hours after the procedure:      Have a responsible adult stay with you. It is important to have someone help care for you until you are awake and alert.  Rest as needed.  Do not: ? Participate in activities in which you could fall or become injured. ? Drive. ? Use heavy machinery. ? Drink alcohol. ? Take sleeping pills or medicines that cause drowsiness. ? Make important decisions or sign legal documents. ? Take care of children on your own. Eating and drinking  Follow the diet that is recommended by your health care provider.  If you vomit, drink water, juice, or soup when you can drink without vomiting.  Make sure you have little or no nausea before eating solid foods. General instructions  Take over-the-counter and  prescription medicines only as told by your health care provider.  If you have sleep apnea, surgery and certain medicines can increase your risk for breathing problems. Follow instructions from your health care provider about wearing your sleep device: ? Anytime you are sleeping, including during daytime naps. ? While taking prescription pain medicines, sleeping medicines, or medicines that make you drowsy.  If you smoke, do not smoke without supervision.  Keep all follow-up visits as told by your health care provider. This is important. Contact a health care provider if:  You keep feeling nauseous or you keep vomiting.  You feel light-headed.  You develop a rash.  You have a fever. Get help right away if:  You have trouble breathing. Summary  For several hours after your procedure, you may feel sleepy and have poor judgment.  Have a responsible adult stay with you for at least 24 hours or until you are awake and alert. This information is not intended to replace advice given to you by your health care provider. Make sure you discuss any questions you have with your health care provider. Document Released: 06/15/2015 Document Revised: 05/23/2017 Document Reviewed: 06/15/2015 Elsevier Patient Education  2020 [employer].

## 2018-10-16 NOTE — Telephone Encounter (Signed)
Per Jaclyn Shaggy: regarding patient's EGD/colonoscopy (8/14) prep instructions patient to hold ASA for 2 days, hold Metformin evening before and am of procedure, hold Glimepride morning of procedure.  Thanks   Patient aware

## 2018-10-18 ENCOUNTER — Encounter (HOSPITAL_COMMUNITY)
Admission: RE | Admit: 2018-10-18 | Discharge: 2018-10-18 | Disposition: A | Discharge status: Home / Self Care | Payer: 59 | Source: Ambulatory Visit | Attending: Internal Medicine | Admitting: Internal Medicine

## 2018-10-18 ENCOUNTER — Other Ambulatory Visit (HOSPITAL_COMMUNITY)
Admission: RE | Admit: 2018-10-18 | Discharge: 2018-10-18 | Disposition: A | Discharge status: Home / Self Care | Payer: 59 | Source: Ambulatory Visit | Attending: Internal Medicine | Admitting: Internal Medicine

## 2018-10-18 ENCOUNTER — Other Ambulatory Visit: Payer: Self-pay

## 2018-10-18 ENCOUNTER — Encounter (HOSPITAL_COMMUNITY): Payer: Self-pay

## 2018-10-18 DIAGNOSIS — Z794 Long term (current) use of insulin: Secondary | ICD-10-CM | POA: Diagnosis not present

## 2018-10-18 DIAGNOSIS — I69351 Hemiplegia and hemiparesis following cerebral infarction affecting right dominant side: Secondary | ICD-10-CM | POA: Diagnosis not present

## 2018-10-18 DIAGNOSIS — Z86010 Personal history of colonic polyps: Secondary | ICD-10-CM | POA: Diagnosis not present

## 2018-10-18 DIAGNOSIS — E7800 Pure hypercholesterolemia, unspecified: Secondary | ICD-10-CM | POA: Diagnosis not present

## 2018-10-18 DIAGNOSIS — I10 Essential (primary) hypertension: Secondary | ICD-10-CM | POA: Diagnosis not present

## 2018-10-18 DIAGNOSIS — R112 Nausea with vomiting, unspecified: Secondary | ICD-10-CM

## 2018-10-18 DIAGNOSIS — Z791 Long term (current) use of non-steroidal anti-inflammatories (NSAID): Secondary | ICD-10-CM | POA: Diagnosis not present

## 2018-10-18 DIAGNOSIS — F17210 Nicotine dependence, cigarettes, uncomplicated: Secondary | ICD-10-CM | POA: Diagnosis not present

## 2018-10-18 DIAGNOSIS — Z7982 Long term (current) use of aspirin: Secondary | ICD-10-CM | POA: Diagnosis not present

## 2018-10-18 DIAGNOSIS — Z79890 Hormone replacement therapy (postmenopausal): Secondary | ICD-10-CM | POA: Diagnosis not present

## 2018-10-18 DIAGNOSIS — E1140 Type 2 diabetes mellitus with diabetic neuropathy, unspecified: Secondary | ICD-10-CM | POA: Diagnosis not present

## 2018-10-18 DIAGNOSIS — K219 Gastro-esophageal reflux disease without esophagitis: Secondary | ICD-10-CM | POA: Diagnosis not present

## 2018-10-18 DIAGNOSIS — R1013 Epigastric pain: Secondary | ICD-10-CM | POA: Diagnosis present

## 2018-10-18 DIAGNOSIS — F329 Major depressive disorder, single episode, unspecified: Secondary | ICD-10-CM | POA: Diagnosis not present

## 2018-10-18 DIAGNOSIS — Z800 Family history of malignant neoplasm of digestive organs: Secondary | ICD-10-CM | POA: Diagnosis not present

## 2018-10-18 DIAGNOSIS — E039 Hypothyroidism, unspecified: Secondary | ICD-10-CM | POA: Diagnosis not present

## 2018-10-18 DIAGNOSIS — K2950 Unspecified chronic gastritis without bleeding: Secondary | ICD-10-CM | POA: Diagnosis not present

## 2018-10-18 DIAGNOSIS — K635 Polyp of colon: Secondary | ICD-10-CM | POA: Diagnosis not present

## 2018-10-18 DIAGNOSIS — Z79899 Other long term (current) drug therapy: Secondary | ICD-10-CM | POA: Diagnosis not present

## 2018-10-18 DIAGNOSIS — Z1211 Encounter for screening for malignant neoplasm of colon: Secondary | ICD-10-CM | POA: Diagnosis not present

## 2018-10-18 DIAGNOSIS — R1010 Upper abdominal pain, unspecified: Secondary | ICD-10-CM

## 2018-10-18 DIAGNOSIS — K5730 Diverticulosis of large intestine without perforation or abscess without bleeding: Secondary | ICD-10-CM | POA: Diagnosis not present

## 2018-10-18 DIAGNOSIS — Z20828 Contact with and (suspected) exposure to other viral communicable diseases: Secondary | ICD-10-CM | POA: Diagnosis not present

## 2018-10-18 DIAGNOSIS — F419 Anxiety disorder, unspecified: Secondary | ICD-10-CM | POA: Diagnosis not present

## 2018-10-18 HISTORY — DX: Polyneuropathy, unspecified: G62.9

## 2018-10-18 HISTORY — DX: Gastro-esophageal reflux disease without esophagitis: K21.9

## 2018-10-18 HISTORY — DX: Pure hypercholesterolemia, unspecified: E78.00

## 2018-10-18 HISTORY — DX: Essential (primary) hypertension: I10

## 2018-10-18 LAB — SARS CORONAVIRUS 2 (TAT 6-24 HRS): SARS Coronavirus 2: NEGATIVE

## 2018-10-18 LAB — BASIC METABOLIC PANEL
Anion gap: 12 (ref 5–15)
BUN: 9 mg/dL (ref 8–23)
CO2: 22 mmol/L (ref 22–32)
Calcium: 9.6 mg/dL (ref 8.9–10.3)
Chloride: 108 mmol/L (ref 98–111)
Creatinine, Ser: 0.59 mg/dL (ref 0.44–1.00)
GFR calc Af Amer: >60 mL/min (ref >60)
GFR calc non Af Amer: >60 mL/min (ref >60)
Glucose, Bld: 129 mg/dL — ABNORMAL HIGH (ref 70–99)
Potassium: 3.9 mmol/L (ref 3.5–5.1)
Sodium: 142 mmol/L (ref 135–145)

## 2018-10-18 LAB — HEMOGLOBIN A1C
Hgb A1c MFr Bld: 6.4 % — ABNORMAL HIGH (ref 4.8–5.6)
Mean Plasma Glucose: 136.98 mg/dL

## 2018-10-18 LAB — GLUCOSE, CAPILLARY: Glucose-Capillary: 127 mg/dL — ABNORMAL HIGH (ref 70–99)

## 2018-10-20 ENCOUNTER — Ambulatory Visit (HOSPITAL_COMMUNITY)
Admission: RE | Admit: 2018-10-20 | Discharge: 2018-10-20 | Disposition: A | Discharge status: Home / Self Care | Payer: 59 | Attending: Internal Medicine | Admitting: Internal Medicine

## 2018-10-20 ENCOUNTER — Encounter (HOSPITAL_COMMUNITY)
Admission: RE | Disposition: A | Discharge status: Home / Self Care | Payer: Self-pay | Source: Home / Self Care | Attending: Internal Medicine

## 2018-10-20 ENCOUNTER — Encounter (HOSPITAL_COMMUNITY): Payer: Self-pay | Admitting: Anesthesiology

## 2018-10-20 ENCOUNTER — Ambulatory Visit (HOSPITAL_COMMUNITY): Payer: 59 | Admitting: Anesthesiology

## 2018-10-20 DIAGNOSIS — E039 Hypothyroidism, unspecified: Secondary | ICD-10-CM | POA: Insufficient documentation

## 2018-10-20 DIAGNOSIS — R109 Unspecified abdominal pain: Secondary | ICD-10-CM | POA: Insufficient documentation

## 2018-10-20 DIAGNOSIS — Z800 Family history of malignant neoplasm of digestive organs: Secondary | ICD-10-CM | POA: Diagnosis not present

## 2018-10-20 DIAGNOSIS — R1013 Epigastric pain: Secondary | ICD-10-CM

## 2018-10-20 DIAGNOSIS — K5730 Diverticulosis of large intestine without perforation or abscess without bleeding: Secondary | ICD-10-CM | POA: Insufficient documentation

## 2018-10-20 DIAGNOSIS — R112 Nausea with vomiting, unspecified: Secondary | ICD-10-CM

## 2018-10-20 DIAGNOSIS — D124 Benign neoplasm of descending colon: Secondary | ICD-10-CM | POA: Diagnosis not present

## 2018-10-20 DIAGNOSIS — K219 Gastro-esophageal reflux disease without esophagitis: Secondary | ICD-10-CM | POA: Insufficient documentation

## 2018-10-20 DIAGNOSIS — Z791 Long term (current) use of non-steroidal anti-inflammatories (NSAID): Secondary | ICD-10-CM | POA: Insufficient documentation

## 2018-10-20 DIAGNOSIS — Z1211 Encounter for screening for malignant neoplasm of colon: Secondary | ICD-10-CM | POA: Insufficient documentation

## 2018-10-20 DIAGNOSIS — D125 Benign neoplasm of sigmoid colon: Secondary | ICD-10-CM

## 2018-10-20 DIAGNOSIS — K635 Polyp of colon: Secondary | ICD-10-CM | POA: Insufficient documentation

## 2018-10-20 DIAGNOSIS — E1140 Type 2 diabetes mellitus with diabetic neuropathy, unspecified: Secondary | ICD-10-CM | POA: Insufficient documentation

## 2018-10-20 DIAGNOSIS — I69351 Hemiplegia and hemiparesis following cerebral infarction affecting right dominant side: Secondary | ICD-10-CM | POA: Insufficient documentation

## 2018-10-20 DIAGNOSIS — Z7982 Long term (current) use of aspirin: Secondary | ICD-10-CM | POA: Insufficient documentation

## 2018-10-20 DIAGNOSIS — F17210 Nicotine dependence, cigarettes, uncomplicated: Secondary | ICD-10-CM | POA: Insufficient documentation

## 2018-10-20 DIAGNOSIS — Z20828 Contact with and (suspected) exposure to other viral communicable diseases: Secondary | ICD-10-CM | POA: Insufficient documentation

## 2018-10-20 DIAGNOSIS — Z09 Encounter for follow-up examination after completed treatment for conditions other than malignant neoplasm: Secondary | ICD-10-CM | POA: Diagnosis not present

## 2018-10-20 DIAGNOSIS — K2950 Unspecified chronic gastritis without bleeding: Secondary | ICD-10-CM | POA: Insufficient documentation

## 2018-10-20 DIAGNOSIS — R1010 Upper abdominal pain, unspecified: Secondary | ICD-10-CM

## 2018-10-20 DIAGNOSIS — F329 Major depressive disorder, single episode, unspecified: Secondary | ICD-10-CM | POA: Insufficient documentation

## 2018-10-20 DIAGNOSIS — Z794 Long term (current) use of insulin: Secondary | ICD-10-CM | POA: Insufficient documentation

## 2018-10-20 DIAGNOSIS — Z86010 Personal history of colonic polyps: Secondary | ICD-10-CM | POA: Diagnosis not present

## 2018-10-20 DIAGNOSIS — F419 Anxiety disorder, unspecified: Secondary | ICD-10-CM | POA: Insufficient documentation

## 2018-10-20 DIAGNOSIS — E7800 Pure hypercholesterolemia, unspecified: Secondary | ICD-10-CM | POA: Insufficient documentation

## 2018-10-20 DIAGNOSIS — Z79890 Hormone replacement therapy (postmenopausal): Secondary | ICD-10-CM | POA: Insufficient documentation

## 2018-10-20 DIAGNOSIS — K228 Other specified diseases of esophagus: Secondary | ICD-10-CM | POA: Diagnosis not present

## 2018-10-20 DIAGNOSIS — K2970 Gastritis, unspecified, without bleeding: Secondary | ICD-10-CM | POA: Diagnosis not present

## 2018-10-20 DIAGNOSIS — I10 Essential (primary) hypertension: Secondary | ICD-10-CM | POA: Insufficient documentation

## 2018-10-20 DIAGNOSIS — Z79899 Other long term (current) drug therapy: Secondary | ICD-10-CM | POA: Insufficient documentation

## 2018-10-20 HISTORY — PX: COLONOSCOPY WITH PROPOFOL: SHX5780

## 2018-10-20 HISTORY — PX: ESOPHAGOGASTRODUODENOSCOPY (EGD) WITH PROPOFOL: SHX5813

## 2018-10-20 HISTORY — PX: BIOPSY: SHX5522

## 2018-10-20 HISTORY — PX: POLYPECTOMY: SHX5525

## 2018-10-20 LAB — GLUCOSE, CAPILLARY: Glucose-Capillary: 200 mg/dL — ABNORMAL HIGH (ref 70–99)

## 2018-10-20 SURGERY — ESOPHAGOGASTRODUODENOSCOPY (EGD) WITH PROPOFOL
Anesthesia: General

## 2018-10-20 MED ORDER — MIDAZOLAM HCL 2 MG/2ML IJ SOLN: 0.5000 mg | Freq: Once | INTRAMUSCULAR | Status: DC | PRN

## 2018-10-20 MED ORDER — HYDROCODONE-ACETAMINOPHEN 7.5-325 MG PO TABS: 1.0000 | ORAL_TABLET | Freq: Once | ORAL | Status: DC | PRN

## 2018-10-20 MED ORDER — KETAMINE HCL 10 MG/ML IJ SOLN
INTRAMUSCULAR | Status: DC | PRN
  Administered 2018-10-20: 10 mg via INTRAVENOUS

## 2018-10-20 MED ORDER — DICYCLOMINE HCL 10 MG PO CAPS: 10.0000 mg | ORAL_CAPSULE | Freq: Three times a day (TID) | ORAL | 1 refills | Status: DC

## 2018-10-20 MED ORDER — HYDROMORPHONE HCL 1 MG/ML IJ SOLN: 0.2500 mg | INTRAMUSCULAR | Status: DC | PRN

## 2018-10-20 MED ORDER — GLYCOPYRROLATE 0.2 MG/ML IJ SOLN
INTRAMUSCULAR | Status: DC | PRN
  Administered 2018-10-20: 0.2 mg via INTRAVENOUS

## 2018-10-20 MED ORDER — LACTATED RINGERS IV SOLN
INTRAVENOUS | Status: DC | PRN
  Administered 2018-10-20 (×2): via INTRAVENOUS

## 2018-10-20 MED ORDER — PROMETHAZINE HCL 25 MG/ML IJ SOLN: 6.2500 mg | INTRAMUSCULAR | Status: DC | PRN

## 2018-10-20 MED ORDER — PROPOFOL 500 MG/50ML IV EMUL
INTRAVENOUS | Status: DC | PRN
  Administered 2018-10-20: 150 ug/kg/min via INTRAVENOUS
  Administered 2018-10-20: 12:00:00 via INTRAVENOUS

## 2018-10-20 MED ORDER — LIDOCAINE HCL (CARDIAC) PF 100 MG/5ML IV SOSY
PREFILLED_SYRINGE | INTRAVENOUS | Status: DC | PRN
  Administered 2018-10-20: 40 mg via INTRAVENOUS

## 2018-10-20 MED ORDER — KETAMINE HCL 50 MG/5ML IJ SOSY
PREFILLED_SYRINGE | INTRAMUSCULAR | Status: AC
  Filled 2018-10-20: qty 5

## 2018-10-20 MED ORDER — PROPOFOL 10 MG/ML IV BOLUS
INTRAVENOUS | Status: DC | PRN
  Administered 2018-10-20: 20 mg via INTRAVENOUS

## 2018-10-20 MED ORDER — LACTATED RINGERS IV SOLN: INTRAVENOUS | Status: DC

## 2018-10-20 NOTE — Anesthesia Procedure Notes (Signed)
Procedure Name: General with mask airway Performed by: Andree Elk, Amy A, CRNA Pre-anesthesia Checklist: Timeout performed, Patient being monitored, Suction available, Emergency Drugs available and Patient identified Oxygen Delivery Method: Non-rebreather mask

## 2018-10-20 NOTE — Op Note (Signed)
[facility] Patient Name: Amber Hawkins Procedure Date: 10/20/2018 10:56 AM MRN: [id number] Date of Birth: 1955/12/16 Attending MD: Hildred Laser , MD CSN: [id number] Age: 63 Admit Type: Outpatient Procedure:                Upper GI endoscopy Indications:              Epigastric abdominal pain, Nausea with vomiting Providers:                Hildred Laser, MD, Otis Peak B. Sharon Seller, RN, Raphael Gibney, Technician Referring MD:             Arsenio Katz, NP Medicines:                Propofol per Anesthesia Complications:            No immediate complications. Estimated Blood Loss:     Estimated blood loss was minimal. Procedure:                Pre-Anesthesia Assessment:                           - Prior to the procedure, a History and Physical                            was performed, and patient medications and                            allergies were reviewed. The patient's tolerance of                            previous anesthesia was also reviewed. The risks                            and benefits of the procedure and the sedation                            options and risks were discussed with the patient.                            All questions were answered, and informed consent                            was obtained. Prior Anticoagulants: The patient has                            taken no previous anticoagulant or antiplatelet                            agents except for aspirin and has taken no previous                            anticoagulant or antiplatelet agents except for  NSAID medication. ASA Grade Assessment: III - A                            patient with severe systemic disease. After                            reviewing the risks and benefits, the patient was                            deemed in satisfactory condition to undergo the                            procedure.                           After  obtaining informed consent, the endoscope was                            passed under direct vision. Throughout the                            procedure, the patient's blood pressure, pulse, and                            oxygen saturations were monitored continuously. The                            [id number]-H190 ([id number]) scope was introduced through the                            mouth, and advanced to the second part of duodenum.                            The upper GI endoscopy was accomplished without                            difficulty. The patient tolerated the procedure                            well. Scope In: 11:34:50 AM Scope Out: 11:43:22 AM Total Procedure Duration: 0 hours 8 minutes 32 seconds  Findings:      The examined esophagus was normal.      The Z-line was irregular and was found 39 cm from the incisors.      Patchy mild inflammation characterized by congestion (edema), erosions       and erythema was found in the gastric body. Biopsies were taken with a       cold forceps for histology. The pathology specimen was placed into       Bottle Number 1.      The exam of the stomach was otherwise normal.      The duodenal bulb and second portion of the duodenum were normal. Impression:               - Normal esophagus.                           -  Z-line irregular, 39 cm from the incisors.                           - Gastritis. Biopsied.                           - Normal duodenal bulb and second portion of the                            duodenum. Moderate Sedation:      Per Anesthesia Care Recommendation:           - Patient has a contact number available for                            emergencies. The signs and symptoms of potential                            delayed complications were discussed with the                            patient. Return to normal activities tomorrow.                            Written discharge instructions were provided to the                             patient.                           - Resume previous diet today.                           - Continue present medications.                           - Resume aspirin at prior dose tomorrow.                           - Await pathology results. Procedure Code(s):        --- Professional ---                           [phone], Esophagogastroduodenoscopy, flexible,                            transoral; with biopsy, single or multiple Diagnosis Code(s):        --- Professional ---                           K22.8, Other specified diseases of esophagus                           K29.70, Gastritis, unspecified, without bleeding                           R10.13, Epigastric pain  R11.2, Nausea with vomiting, unspecified CPT copyright 2019 American [employer]. All rights reserved. The codes documented in this report are preliminary and upon coder review may  be revised to meet current compliance requirements. Hildred Laser, MD Hildred Laser, MD 10/20/2018 12:31:52 PM This report has been signed electronically. Number of Addenda: 0

## 2018-10-20 NOTE — Anesthesia Postprocedure Evaluation (Signed)
Anesthesia Post Note  Patient: Amber Hawkins  Procedure(s) Performed: ESOPHAGOGASTRODUODENOSCOPY (EGD) WITH PROPOFOL (N/A ) COLONOSCOPY WITH PROPOFOL (N/A ) BIOPSY POLYPECTOMY  Patient location during evaluation: PACU Anesthesia Type: General Level of consciousness: awake and alert and oriented Pain management: pain level controlled Vital Signs Assessment: post-procedure vital signs reviewed and stable Respiratory status: spontaneous breathing Cardiovascular status: stable Postop Assessment: no apparent nausea or vomiting Anesthetic complications: no     Last Vitals:  Vitals:   10/20/18 1115 10/20/18 1225  BP: 114/74 99/74  Pulse:  91  Resp:  15  Temp: 36.6 C 36.6 C  SpO2: 95% 97%    Last Pain:  Vitals:   10/20/18 1225  PainSc: 0-No pain                 ADAMS, AMY A

## 2018-10-20 NOTE — H&P (Addendum)
Amber Hawkins is an 63 y.o. female.   Chief Complaint: Patient is here for EGD and colonoscopy. HPI: Patient 63 year old Caucasian female with chronic GERD history of diabetes mellitus who presents with a year history of postprandial epigastric pain nausea diaphoreses and weakness.  Vomiting is sporadic and she usually vomits bile.  No history of hematemesis or melena.  He remains with intermittent heartburn despite taking pantoprazole.  She says she has not lost any weight despite his symptoms.  She also gives history of chronic diarrhea.  No rectal bleeding. His last colonoscopy was in 2011 with removal of small polyp and was hyperplastic.  Prior colonoscopy revealed tubular adenoma.  Family history significant for colon carcinoma and multiple relatives.  Maternal grandfather had colon cancer.  Mother had colon cancer in number of her aunts and uncles.  She says at least 5 of them had colon cancer. Patient is on full dose aspirin because of stroke.  She also takes meloxicam..  She does not drink alcohol.  She smokes sporadically.  She is trying to quit.  Last time she had cigarette was 5 or 6 days ago.  She states she may smoke when she is nervous.  Past Medical History:  Diagnosis Date  . Anxiety   . CVA (cerebral vascular accident) ([place])    right sided weakness, with ling term memory loss.  . Diabetes mellitus type II, controlled ([place])   . Fibromyalgia   . GERD (gastroesophageal reflux disease)   . Hypercholesteremia   . Hypertension   . Hypothyroidism   . Neuropathy     Past Surgical History:  Procedure Laterality Date  . ABDOMINAL HYSTERECTOMY    . APPENDECTOMY    . CHOLECYSTECTOMY    . TONSILLECTOMY    . TUBAL LIGATION      Family History  Problem Relation Age of Onset  . Colon cancer Mother   . Colon cancer Maternal Grandfather   . Colon cancer Other   . Microcephaly Father    Social History:  reports that she quit smoking about 8 years ago. Her smoking use included  cigarettes. She has a 1.00 pack-year smoking history. She has never used smokeless tobacco. She reports previous alcohol use. She reports that she does not use drugs.  Allergies:  Allergies  Allergen Reactions  . Baclofen Nausea And Vomiting    Medications Prior to Admission  Medication Sig Dispense Refill  . aspirin 325 MG tablet Take 325 mg by mouth daily.    Marland Kitchen atorvastatin (LIPITOR) 40 MG tablet Take 40 mg by mouth daily.    Marland Kitchen b complex vitamins capsule Take 1 capsule by mouth daily.    . canagliflozin (INVOKANA) 100 MG TABS tablet Take 100 mg by mouth daily.    . carvedilol (COREG) 3.125 MG tablet Take 3.125 mg by mouth 2 (two) times daily with a meal.    . Cholecalciferol (VITAMIN D3) 1.25 MG (50000 UT) CAPS Take 1 capsule by mouth once a week.    . clonazePAM (KLONOPIN) 1 MG tablet Take 1 mg by mouth 3 (three) times daily as needed for anxiety.    . DULoxetine (CYMBALTA) 20 MG capsule Take 20 mg by mouth daily.    . fenofibrate (TRICOR) 145 MG tablet Take 145 mg by mouth daily.    . folic acid (FOLVITE) 539 MCG tablet Take 400 mcg by mouth daily.    Marland Kitchen gabapentin (NEURONTIN) 300 MG capsule Take 300 mg by mouth 3 (three) times daily.    Marland Kitchen  hydrochlorothiazide (HYDRODIURIL) 25 MG tablet Take 25 mg by mouth daily.    . Ibuprofen 200 MG CAPS Take 400 mg by mouth 2 (two) times daily as needed.     . insulin aspart (NOVOLOG FLEXPEN) 100 UNIT/ML FlexPen Inject 1-20 Units into the skin 3 (three) times daily as needed. Patient uses a carb ratio 1:10 units per meal or 4-6 units with food add correction of 2 units per 50 above 200 pt uses up to 20 units daily    . Insulin Glargine (LANTUS SOLOSTAR) 100 UNIT/ML Solostar Pen Inject 5 Units into the skin daily as needed.    Marland Kitchen levothyroxine (SYNTHROID) 175 MCG tablet Take 175 mcg by mouth daily before breakfast.    . lisinopril (ZESTRIL) 10 MG tablet Take 10 mg by mouth daily.     Marland Kitchen loratadine (CLARITIN) 10 MG tablet Take 10 mg by mouth daily.    .  meloxicam (MOBIC) 7.5 MG tablet Take 7.5 mg by mouth 2 (two) times daily.    . metFORMIN (GLUCOPHAGE) 500 MG tablet Take 500 mg by mouth 2 (two) times daily with a meal.    . naproxen sodium (ALEVE) 220 MG tablet Take 220 mg by mouth daily as needed.    . ondansetron (ZOFRAN) 4 MG tablet Take 1 tablet (4 mg total) by mouth every 8 (eight) hours as needed for nausea or vomiting. 30 tablet 0  . pantoprazole (PROTONIX) 40 MG tablet Take 40 mg by mouth daily.      Results for orders placed or performed during the hospital encounter of 10/20/18 (from the past 48 hour(s))  Glucose, capillary     Status: Abnormal   Collection Time: 10/20/18 10:58 AM  Result Value Ref Range   Glucose-Capillary 200 (H) 70 - 99 mg/dL   No results found.  ROS  There were no vitals taken for this visit. Physical Exam  Constitutional: She appears well-developed and well-nourished.  HENT:  Mouth/Throat: Oropharynx is clear and moist.  Eyes: Conjunctivae are normal.  Neck: No thyromegaly present.  Cardiovascular: Normal rate, regular rhythm and normal heart sounds.  No murmur heard. Respiratory: Effort normal and breath sounds normal.  GI:  Abdomen is symmetrical.  She has Dexcom device in right lower quadrant.  Abdomen is soft.  She has mild midepigastric tenderness.  No organomegaly or masses.  Musculoskeletal:        General: No edema.  Lymphadenopathy:    She has no cervical adenopathy.  Neurological: She is alert.  Skin: Skin is warm and dry.     Assessment/Plan Epigastric pain nausea and vomiting. Chronic diarrhea history of colonic adenoma and family history of colon carcinoma in first-degree relative (mother) and multiple second-degree relatives.  Hildred Laser, MD 10/20/2018, 11:15 AM

## 2018-10-20 NOTE — Op Note (Addendum)
[facility] Patient Name: Amber Hawkins Procedure Date: 10/20/2018 11:44 AM MRN: [id number] Date of Birth: 05/02/1955 Attending MD: Hildred Laser , MD CSN: [id number] Age: 63 Admit Type: Outpatient Procedure:                Colonoscopy Indications:              High risk colon cancer surveillance: Personal                            history of colonic polyps, Family history of colon                            cancer in a first-degree relative before age 66                            years Providers:                Hildred Laser, MD, Otis Peak B. Sharon Seller, RN, Raphael Gibney Tech., Technician, Aram Candela Referring MD:             Arsenio Katz, NP Medicines:                Propofol per Anesthesia Complications:            No immediate complications. Estimated Blood Loss:     Estimated blood loss was minimal. Estimated blood                            loss was minimal. Procedure:                Pre-Anesthesia Assessment:                           - Prior to the procedure, a History and Physical                            was performed, and patient medications and                            allergies were reviewed. The patient's tolerance of                            previous anesthesia was also reviewed. The risks                            and benefits of the procedure and the sedation                            options and risks were discussed with the patient.                            All questions were answered, and informed consent  was obtained. Prior Anticoagulants: The patient has                            taken no previous anticoagulant or antiplatelet                            agents except for aspirin and has taken no previous                            anticoagulant or antiplatelet agents except for                            NSAID medication. ASA Grade Assessment: III - A                            patient with  severe systemic disease. After                            reviewing the risks and benefits, the patient was                            deemed in satisfactory condition to undergo the                            procedure.                           - Prior to the procedure, a History and Physical                            was performed, and patient medications and                            allergies were reviewed. The patient's tolerance of                            previous anesthesia was also reviewed. The risks                            and benefits of the procedure and the sedation                            options and risks were discussed with the patient.                            All questions were answered, and informed consent                            was obtained. ASA Grade Assessment: III - A patient                            with severe systemic disease. After reviewing the  risks and benefits, the patient was deemed in                            satisfactory condition to undergo the procedure.                           After obtaining informed consent, the colonoscope                            was passed under direct vision. Throughout the                            procedure, the patient's blood pressure, pulse, and                            oxygen saturations were monitored continuously. The                            PCF-H190DL ([id number]) scope was introduced through                            the anus and advanced to the the cecum, identified                            by appendiceal orifice and ileocecal valve. The                            colonoscopy was technically difficult and complex                            due to poor bowel prep. The patient tolerated the                            procedure well. The quality of the bowel                            preparation was poor. The ileocecal valve,                            appendiceal  orifice, and rectum were photographed. Scope In: 11:47:54 AM Scope Out: 12:20:59 PM Scope Withdrawal Time: 0 hours 22 minutes 59 seconds  Total Procedure Duration: 0 hours 33 minutes 5 seconds  Findings:      The perianal and digital rectal examinations were normal.      A small polyp was found in the hepatic flexure. The pathology specimen       was placed into Bottle Number 2.      Two sessile polyps were found in the descending colon. The polyps were       small in size. These polyps were removed with a cold snare. Resection       and retrieval were complete. The pathology specimen was placed into       Bottle Number 2.      A 6 mm polyp was found in the recto-sigmoid colon. The polyp was removed  with a cold snare. Resection was complete, but the polyp tissue was only       partially retrieved. The pathology specimen was placed into Bottle       Number 3.      A few small-mouthed diverticula were found in the sigmoid colon.      External hemorrhoids were found during retroflexion. The hemorrhoids       were small. Impression:               - Preparation of the colon was poor.                           - One small polyp at the hepatic flexure.                           - Two small polyps in the descending colon, removed                            with a cold snare. Resected and retrieved.                           - One 6 mm polyp at the recto-sigmoid colon,                            removed with a cold snare. Complete resection.                            Partial retrieval.                           - Diverticulosis in the sigmoid colon. Moderate Sedation:      Per Anesthesia Care Recommendation:           - Patient has a contact number available for                            emergencies. The signs and symptoms of potential                            delayed complications were discussed with the                            patient. Return to normal activities tomorrow.                             Written discharge instructions were provided to the                            patient.                           - Patient has a contact number available for                            emergencies. The signs and symptoms of potential  delayed complications were discussed with the                            patient. Return to normal activities tomorrow.                            Written discharge instructions were provided to the                            patient.                           - Resume previous diet today.                           - Continue present medications.                           - Dicyclomine 10 mg po ac                           - Resume aspirin at prior dose tomorrow.                           - Await pathology results. Procedure Code(s):        --- Professional ---                           [phone], Colonoscopy, flexible; with removal of                            tumor(s), polyp(s), or other lesion(s) by snare                            technique Diagnosis Code(s):        --- Professional ---                           K63.5, Polyp of colon                           Z86.010, Personal history of colonic polyps                           Z80.0, Family history of malignant neoplasm of                            digestive organs                           K57.30, Diverticulosis of large intestine without                            perforation or abscess without bleeding CPT copyright 2019 American Medical Association. All rights reserved. The codes documented in this report are preliminary and upon coder review may  be revised to meet current compliance requirements. Hildred Laser, MD Hildred Laser, MD 10/20/2018  12:38:34 PM This report has been signed electronically. Number of Addenda: 0

## 2018-10-20 NOTE — Transfer of Care (Signed)
Immediate Anesthesia Transfer of Care Note  Patient: Amber Hawkins  Procedure(s) Performed: ESOPHAGOGASTRODUODENOSCOPY (EGD) WITH PROPOFOL (N/A ) COLONOSCOPY WITH PROPOFOL (N/A ) BIOPSY POLYPECTOMY  Patient Location: PACU  Anesthesia Type:General  Level of Consciousness: awake, alert , oriented and patient cooperative  Airway & Oxygen Therapy: Patient Spontanous Breathing  Post-op Assessment: Report given to RN and Post -op Vital signs reviewed and stable  Post vital signs: Reviewed and stable  Last Vitals:  Vitals Value Taken Time  BP 99/74 10/20/18 1225  Temp 36.6 C 10/20/18 1225  Pulse 94 10/20/18 1227  Resp 15 10/20/18 1227  SpO2 95 % 10/20/18 1227  Vitals shown include unvalidated device data.  Last Pain:  Vitals:   10/20/18 1225  PainSc: 0-No pain         Complications: No apparent anesthesia complications

## 2018-10-20 NOTE — Anesthesia Preprocedure Evaluation (Signed)
Anesthesia Evaluation  Patient identified by MRN, date of birth, ID band Patient awake    Reviewed: Allergy & Precautions, NPO status , Patient's Chart, lab work & pertinent test results, reviewed documented beta blocker date and time   Airway Mallampati: II  TM Distance: >3 FB Neck ROM: Full    Dental no notable dental hx. (+) Teeth Intact   Pulmonary neg pulmonary ROS, former smoker,    Pulmonary exam normal breath sounds clear to auscultation       Cardiovascular Exercise Tolerance: Good hypertension, Pt. on medications and Pt. on home beta blockers negative cardio ROS Normal cardiovascular examI Rhythm:Regular Rate:Normal     Neuro/Psych Anxiety Depression States h/o 2 CVAs ~2004,2006 which left her with R sided weakness and LTM loss   Neuromuscular disease CVA, Residual Symptoms negative psych ROS   GI/Hepatic Neg liver ROS, GERD  Medicated and Controlled,Denies any GERD or N/V today    Endo/Other  diabetes, Type 2, Insulin Dependent, Oral Hypoglycemic AgentsHypothyroidism   Renal/GU negative Renal ROS  negative genitourinary   Musculoskeletal  (+) Fibromyalgia -  Abdominal   Peds negative pediatric ROS (+)  Hematology negative hematology ROS (+)   Anesthesia Other Findings   Reproductive/Obstetrics negative OB ROS                             Anesthesia Physical Anesthesia Plan  ASA: III  Anesthesia Plan: General   Post-op Pain Management:    Induction: Intravenous  PONV Risk Score and Plan: 3 and TIVA, Propofol infusion, Ondansetron and Treatment may vary due to age or medical condition  Airway Management Planned: Simple Face Mask and Nasal Cannula  Additional Equipment:   Intra-op Plan:   Post-operative Plan:   Informed Consent: I have reviewed the patients History and Physical, chart, labs and discussed the procedure including the risks, benefits and alternatives  for the proposed anesthesia with the patient or authorized representative who has indicated his/her understanding and acceptance.     Dental advisory given  Plan Discussed with: CRNA  Anesthesia Plan Comments: (Plan Full PPE use  Plan GA with GETA as needed d/w pt -WTP with same after Q&A)        Anesthesia Quick Evaluation

## 2018-10-20 NOTE — Discharge Instructions (Signed)
Resume aspirin on 10/21/2018 Resume other medications as before. Please do not take Aleve or Advil while you are taking aspirin and meloxicam. Dicyclomine 10 mg by mouth 30 minutes before each meal. Resume usual diet. No driving for 24 hours. Physician will call with biopsy results.       Upper Endoscopy, Adult, Care After This sheet gives you information about how to care for yourself after your procedure. Your health care provider may also give you more specific instructions. If you have problems or questions, contact your health care provider. What can I expect after the procedure? After the procedure, it is common to have:  A sore throat.  Mild stomach pain or discomfort.  Bloating.  Nausea. Follow these instructions at home:   Follow instructions from your health care provider about what to eat or drink after your procedure.  Return to your normal activities as told by your health care provider. Ask your health care provider what activities are safe for you.  Take over-the-counter and prescription medicines only as told by your health care provider.  Do not drive for 24 hours if you were given a sedative during your procedure.  Keep all follow-up visits as told by your health care provider. This is important. Contact a health care provider if you have:  A sore throat that lasts longer than one day.  Trouble swallowing. Get help right away if:  You vomit blood or your vomit looks like coffee grounds.  You have: ? A fever. ? Bloody, black, or tarry stools. ? A severe sore throat or you cannot swallow. ? Difficulty breathing. ? Severe pain in your chest or abdomen. Summary  After the procedure, it is common to have a sore throat, mild stomach discomfort, bloating, and nausea.  Do not drive for 24 hours if you were given a sedative during the procedure.  Follow instructions from your health care provider about what to eat or drink after your  procedure.  Return to your normal activities as told by your health care provider. This information is not intended to replace advice given to you by your health care provider. Make sure you discuss any questions you have with your health care provider. Document Released: 08/24/2011 Document Revised: 08/16/2017 Document Reviewed: 07/25/2017 Elsevier Patient Education  2020 [employer].      Colonoscopy, Adult, Care After This sheet gives you information about how to care for yourself after your procedure. Your doctor may also give you more specific instructions. If you have problems or questions, call your doctor. What can I expect after the procedure? After the procedure, it is common to have:  A small amount of blood in your poop for 24 hours.  Some gas.  Mild cramping or bloating in your belly. Follow these instructions at home: General instructions  For the first 24 hours after the procedure: ? Do not drive or use machinery. ? Do not sign important documents. ? Do not drink alcohol. ? Do your daily activities more slowly than normal. ? Eat foods that are soft and easy to digest.  Take over-the-counter or prescription medicines only as told by your doctor. To help cramping and bloating:   Try walking around.  Put heat on your belly (abdomen) as told by your doctor. Use a heat source that your doctor recommends, such as a moist heat pack or a heating pad. ? Put a towel between your skin and the heat source. ? Leave the heat on for 20-30 minutes. ? Remove  the heat if your skin turns bright red. This is especially important if you cannot feel pain, heat, or cold. You can get burned. Eating and drinking   Drink enough fluid to keep your pee (urine) clear or pale yellow.  Return to your normal diet as told by your doctor. Avoid heavy or fried foods that are hard to digest.  Avoid drinking alcohol for as long as told by your doctor. Contact a doctor if:  You have  blood in your poop (stool) 2-3 days after the procedure. Get help right away if:  You have more than a small amount of blood in your poop.  You see large clumps of tissue (blood clots) in your poop.  Your belly is swollen.  You feel sick to your stomach (nauseous).  You throw up (vomit).  You have a fever.  You have belly pain that gets worse, and medicine does not help your pain. Summary  After the procedure, it is common to have a small amount of blood in your poop. You may also have mild cramping and bloating in your belly.  For the first 24 hours after the procedure, do not drive or use machinery, do not sign important documents, and do not drink alcohol.  Get help right away if you have a lot of blood in your poop, feel sick to your stomach, have a fever, or have more belly pain. This information is not intended to replace advice given to you by your health care provider. Make sure you discuss any questions you have with your health care provider. Document Released: 03/27/2010 Document Revised: 12/23/2016 Document Reviewed: 11/17/2015 Elsevier Patient Education  2020 [employer] After These instructions provide you with information about caring for yourself after your procedure. Your health care provider may also give you more specific instructions. Your treatment has been planned according to current medical practices, but problems sometimes occur. Call your health care provider if you have any problems or questions after your procedure. What can I expect after the procedure? After your procedure, you may:  Feel sleepy for several hours.  Feel clumsy and have poor balance for several hours.  Feel forgetful about what happened after the procedure.  Have poor judgment for several hours.  Feel nauseous or vomit.  Have a sore throat if you had a breathing tube during the procedure. Follow these instructions at home: For at least 24  hours after the procedure:      Have a responsible adult stay with you. It is important to have someone help care for you until you are awake and alert.  Rest as needed.  Do not: ? Participate in activities in which you could fall or become injured. ? Drive. ? Use heavy machinery. ? Drink alcohol. ? Take sleeping pills or medicines that cause drowsiness. ? Make important decisions or sign legal documents. ? Take care of children on your own. Eating and drinking  Follow the diet that is recommended by your health care provider.  If you vomit, drink water, juice, or soup when you can drink without vomiting.  Make sure you have little or no nausea before eating solid foods. General instructions  Take over-the-counter and prescription medicines only as told by your health care provider.  If you have sleep apnea, surgery and certain medicines can increase your risk for breathing problems. Follow instructions from your health care provider about wearing your sleep device: ? Anytime you are sleeping, including  during daytime naps. ? While taking prescription pain medicines, sleeping medicines, or medicines that make you drowsy.  If you smoke, do not smoke without supervision.  Keep all follow-up visits as told by your health care provider. This is important. Contact a health care provider if:  You keep feeling nauseous or you keep vomiting.  You feel light-headed.  You develop a rash.  You have a fever. Get help right away if:  You have trouble breathing. Summary  For several hours after your procedure, you may feel sleepy and have poor judgment.  Have a responsible adult stay with you for at least 24 hours or until you are awake and alert. This information is not intended to replace advice given to you by your health care provider. Make sure you discuss any questions you have with your health care provider. Document Released: 06/15/2015 Document Revised: 05/23/2017  Document Reviewed: 06/15/2015 Elsevier Patient Education  2020 [employer].

## 2018-10-30 ENCOUNTER — Encounter (HOSPITAL_COMMUNITY): Payer: Self-pay | Admitting: Internal Medicine

## 2018-11-11 ENCOUNTER — Other Ambulatory Visit (INDEPENDENT_AMBULATORY_CARE_PROVIDER_SITE_OTHER): Payer: Self-pay | Admitting: Internal Medicine

## 2018-12-12 ENCOUNTER — Other Ambulatory Visit (INDEPENDENT_AMBULATORY_CARE_PROVIDER_SITE_OTHER): Payer: Self-pay | Admitting: Internal Medicine

## 2019-04-25 ENCOUNTER — Encounter (HOSPITAL_COMMUNITY): Payer: Self-pay | Admitting: Hematology

## 2019-04-25 ENCOUNTER — Other Ambulatory Visit: Payer: Self-pay

## 2019-04-25 ENCOUNTER — Inpatient Hospital Stay (HOSPITAL_COMMUNITY): Payer: 59 | Attending: Hematology | Admitting: Hematology

## 2019-04-25 ENCOUNTER — Inpatient Hospital Stay (HOSPITAL_COMMUNITY): Payer: 59

## 2019-04-25 VITALS — BP 113/67 | HR 78 | Temp 97.5°F | Resp 18 | Ht 64.0 in | Wt 165.0 lb

## 2019-04-25 DIAGNOSIS — Z8673 Personal history of transient ischemic attack (TIA), and cerebral infarction without residual deficits: Secondary | ICD-10-CM | POA: Diagnosis not present

## 2019-04-25 DIAGNOSIS — F17210 Nicotine dependence, cigarettes, uncomplicated: Secondary | ICD-10-CM

## 2019-04-25 DIAGNOSIS — D72829 Elevated white blood cell count, unspecified: Secondary | ICD-10-CM | POA: Diagnosis present

## 2019-04-25 DIAGNOSIS — Z122 Encounter for screening for malignant neoplasm of respiratory organs: Secondary | ICD-10-CM

## 2019-04-25 DIAGNOSIS — R61 Generalized hyperhidrosis: Secondary | ICD-10-CM | POA: Diagnosis not present

## 2019-04-25 DIAGNOSIS — Z803 Family history of malignant neoplasm of breast: Secondary | ICD-10-CM | POA: Insufficient documentation

## 2019-04-25 DIAGNOSIS — Z800 Family history of malignant neoplasm of digestive organs: Secondary | ICD-10-CM | POA: Diagnosis not present

## 2019-04-25 LAB — CBC WITH DIFFERENTIAL/PLATELET
Abs Immature Granulocytes: 0.09 10*3/uL — ABNORMAL HIGH (ref 0.00–0.07)
Basophils Absolute: 0.1 10*3/uL (ref 0.0–0.1)
Basophils Relative: 1 %
Eosinophils Absolute: 0.4 10*3/uL (ref 0.0–0.5)
Eosinophils Relative: 3 %
HCT: 47.7 % — ABNORMAL HIGH (ref 36.0–46.0)
Hemoglobin: 15.1 g/dL — ABNORMAL HIGH (ref 12.0–15.0)
Immature Granulocytes: 1 %
Lymphocytes Relative: 31 %
Lymphs Abs: 4.6 10*3/uL — ABNORMAL HIGH (ref 0.7–4.0)
MCH: 29.1 pg (ref 26.0–34.0)
MCHC: 31.7 g/dL (ref 30.0–36.0)
MCV: 91.9 fL (ref 80.0–100.0)
Monocytes Absolute: 1 10*3/uL (ref 0.1–1.0)
Monocytes Relative: 7 %
Neutro Abs: 8.7 10*3/uL — ABNORMAL HIGH (ref 1.7–7.7)
Neutrophils Relative %: 57 %
Platelets: 398 10*3/uL (ref 150–400)
RBC: 5.19 MIL/uL — ABNORMAL HIGH (ref 3.87–5.11)
RDW: 14.1 % (ref 11.5–15.5)
WBC: 14.9 10*3/uL — ABNORMAL HIGH (ref 4.0–10.5)
nRBC: 0 % (ref 0.0–0.2)

## 2019-04-25 LAB — COMPREHENSIVE METABOLIC PANEL
ALT: 21 U/L (ref 0–44)
AST: 23 U/L (ref 15–41)
Albumin: 4 g/dL (ref 3.5–5.0)
Alkaline Phosphatase: 63 U/L (ref 38–126)
Anion gap: 10 (ref 5–15)
BUN: 10 mg/dL (ref 8–23)
CO2: 24 mmol/L (ref 22–32)
Calcium: 10.1 mg/dL (ref 8.9–10.3)
Chloride: 106 mmol/L (ref 98–111)
Creatinine, Ser: 0.79 mg/dL (ref 0.44–1.00)
GFR calc Af Amer: >60 mL/min (ref >60)
GFR calc non Af Amer: >60 mL/min (ref >60)
Glucose, Bld: 119 mg/dL — ABNORMAL HIGH (ref 70–99)
Potassium: 4.7 mmol/L (ref 3.5–5.1)
Sodium: 140 mmol/L (ref 135–145)
Total Bilirubin: 0.3 mg/dL (ref 0.3–1.2)
Total Protein: 7.3 g/dL (ref 6.5–8.1)

## 2019-04-25 LAB — SEDIMENTATION RATE: Sed Rate: 3 mm/hr (ref 0–22)

## 2019-04-25 LAB — C-REACTIVE PROTEIN: CRP: 0.9 mg/dL (ref <1.0)

## 2019-04-25 LAB — SAVE SMEAR(SSMR), FOR PROVIDER SLIDE REVIEW

## 2019-04-25 LAB — LACTATE DEHYDROGENASE: LDH: 134 U/L (ref 98–192)

## 2019-04-25 NOTE — Patient Instructions (Addendum)
[facility] at [facility] Discharge Instructions  Follow up in 2-3 weeks with CT scan   Thank you for choosing [facility] at [facility] to provide your oncology and hematology care.  To afford each patient quality time with our provider, please arrive at least 15 minutes before your scheduled appointment time.   If you have a lab appointment with the [facility] please come in thru the Main Entrance and check in at the main information desk.  You need to re-schedule your appointment should you arrive 10 or more minutes late.  We strive to give you quality time with our providers, and arriving late affects you and other patients whose appointments are after yours.  Also, if you no show three or more times for appointments you may be dismissed from the clinic at the providers discretion.     Again, thank you for choosing [facility].  Our hope is that these requests will decrease the amount of time that you wait before being seen by our physicians.       _____________________________________________________________  Should you have questions after your visit to [facility], please contact our office at [phone] between the hours of 8:00 a.m. and 4:30 p.m.  Voicemails left after 4:00 p.m. will not be returned until the following business day.  For prescription refill requests, have your pharmacy contact our office and allow 72 hours.    Due to Covid, you will need to wear a mask upon entering the hospital. If you do not have a mask, a mask will be given to you at the Main Entrance upon arrival. For doctor visits, patients may have 1 support person with them. For treatment visits, patients can not have anyone with them due to social distancing guidelines and our immunocompromised population.

## 2019-04-25 NOTE — Progress Notes (Signed)
CONSULT NOTE  Patient Care Team: Arsenio Katz, NP as PCP - General (Nurse Practitioner)  CHIEF COMPLAINTS/PURPOSE OF CONSULTATION: Leukocytosis  HISTORY OF PRESENTING ILLNESS:  Amber Hawkins [age] y.o. female was sent here by her endocrinologist Dr. Chalmers Cater for leukocytosis.  Patient reports she has seen her PCP for years and has been told she has an elevated WBC.  Patient was given antibiotics for her elevated WBC on multiple occasions.  She has never been referred or seen a hematologist.  Patient denies any chronic infections or need for multiple antibiotics or steroids.  She reports the last time she was on antibiotics was in December when she was told her WBC was elevated.  She denies any infection at that time or pain.  Patient denies any diagnosis of any autoimmune diseases including rheumatoid arthritis or lupus.  Patient does report she smokes anywhere from a half a pack to 2 packs a day depending on her mood.  She reports she started smoking at the age of 19 when her father died.  She reports she is disabled and at home all day alone while her husband works.  She gets anxious and depressed while he is gone.  She is disabled due to to two CVAs with the first one being in 2018.  She is unable to drive due to this.  Patient denies any use of any alcohol or illegal drugs.  Patient reports she does have a history of an appendectomy, hysterectomy, and cholecystectomy.  She has never been told she has an enlarged spleen.  Patient reports she does have intense night sweats.  She sometimes has to get up and change her close during the night.  She denies any fevers chills or unexplained weight loss.  She denies any recent chest pain shortness of breath on exertion, presyncopal episodes, or palpitations.  She denies any recent bleeding such as epistasis, hematuria or hematochezia.  She has no prior history or diagnosis of cancer.  Her age-appropriate screenings are up-to-date.  She has never donated or received  any blood transfusions. Patient has a positive family history of maternal aunt with breast cancer.  Her mother had colon cancer.  Also 6 maternal uncles with colon cancer.   MEDICAL HISTORY:  Past Medical History:  Diagnosis Date  . Anxiety   . CVA (cerebral vascular accident) ([place])    right sided weakness, with ling term memory loss.  . Diabetes mellitus type II, controlled ([place])   . Fibromyalgia   . GERD (gastroesophageal reflux disease)   . Hypercholesteremia   . Hypertension   . Hypothyroidism   . Neuropathy     SURGICAL HISTORY: Past Surgical History:  Procedure Laterality Date  . ABDOMINAL HYSTERECTOMY    . APPENDECTOMY    . BIOPSY  10/20/2018   Procedure: BIOPSY;  Surgeon: Rogene Houston, MD;  [facility];  Service: Endoscopy;;  gastric  . CHOLECYSTECTOMY    . COLONOSCOPY WITH PROPOFOL N/A 10/20/2018   Procedure: COLONOSCOPY WITH PROPOFOL;  Surgeon: Rogene Houston, MD;  Location: [facility];  Service: Endoscopy;  Laterality: N/A;  . ESOPHAGOGASTRODUODENOSCOPY (EGD) WITH PROPOFOL N/A 10/20/2018   Procedure: ESOPHAGOGASTRODUODENOSCOPY (EGD) WITH PROPOFOL;  Surgeon: Rogene Houston, MD;  [facility]: [facility];  Service: Endoscopy;  Laterality: N/A;  1220  . POLYPECTOMY  10/20/2018   Procedure: POLYPECTOMY;  Surgeon: Rogene Houston, MD;  [facility];  Service: Endoscopy;;  colon   . TONSILLECTOMY    . TUBAL LIGATION  SOCIAL HISTORY: Social History   Socioeconomic History  . Marital status: Married    Spouse name: Jeneen Rinks   . Number of children: 1  . Years of education: Not on file  . Highest education level: Not on file  Occupational History  . Occupation: unemployed  Tobacco Use  . Smoking status: Former Smoker    Packs/day: 0.25    Years: 4.00    Pack years: 1.00    Types: Cigarettes    Quit date: 10/18/2010    Years since quitting: 8.5  . Smokeless tobacco: Never Used  Substance and Sexual Activity  . Alcohol use: Not  Currently  . Drug use: Never  . Sexual activity: Not Currently    Birth control/protection: Surgical  Other Topics Concern  . Not on file  Social History Narrative  . Not on file   Social Determinants of Health   Financial Resource Strain:   . Difficulty of Paying Living Expenses: Not on file  Food Insecurity:   . Worried About Charity fundraiser in the Last Year: Not on file  . Ran Out of Food in the Last Year: Not on file  Transportation Needs:   . Lack of Transportation (Medical): Not on file  . Lack of Transportation (Non-Medical): Not on file  Physical Activity:   . Days of Exercise per Week: Not on file  . Minutes of Exercise per Session: Not on file  Stress:   . Feeling of Stress : Not on file  Social Connections:   . Frequency of Communication with Friends and Family: Not on file  . Frequency of Social Gatherings with Friends and Family: Not on file  . Attends Religious Services: Not on file  . Active Member of Clubs or Organizations: Not on file  . Attends Archivist Meetings: Not on file  . Marital Status: Not on file  Intimate Partner Violence:   . Fear of Current or Ex-Partner: Not on file  . Emotionally Abused: Not on file  . Physically Abused: Not on file  . Sexually Abused: Not on file    FAMILY HISTORY: Family History  Problem Relation Age of Onset  . Colon cancer Mother   . Colon cancer Maternal Grandfather   . Colon cancer Other   . Microcephaly Father     ALLERGIES:  is allergic to baclofen.  MEDICATIONS:  Current Outpatient Medications  Medication Sig Dispense Refill  . aspirin 325 MG tablet Take 1 tablet (325 mg total) by mouth daily.    Marland Kitchen atorvastatin (LIPITOR) 40 MG tablet Take 40 mg by mouth daily.    Marland Kitchen b complex vitamins capsule Take 1 capsule by mouth daily.    . canagliflozin (INVOKANA) 100 MG TABS tablet Take 100 mg by mouth daily.    . carvedilol (COREG) 3.125 MG tablet Take 3.125 mg by mouth 2 (two) times daily with a  meal.    . Cholecalciferol (VITAMIN D3) 1.25 MG (50000 UT) CAPS Take 1 capsule by mouth once a week.    . dicyclomine (BENTYL) 10 MG capsule TAKE 1 CAPSULE (10 MG TOTAL) BY MOUTH 3 (THREE) TIMES DAILY BEFORE MEALS. 90 capsule 5  . DULoxetine (CYMBALTA) 20 MG capsule Take 20 mg by mouth daily.    . fenofibrate (TRICOR) 145 MG tablet Take 145 mg by mouth daily.    . folic acid (FOLVITE) Q000111Q MCG tablet Take 400 mcg by mouth daily.    Marland Kitchen gabapentin (NEURONTIN) 300 MG capsule Take 300 mg by  mouth 3 (three) times daily.    . hydrochlorothiazide (HYDRODIURIL) 25 MG tablet Take 25 mg by mouth daily.    . insulin aspart (NOVOLOG FLEXPEN) 100 UNIT/ML FlexPen Inject 1-20 Units into the skin 3 (three) times daily as needed. Patient uses a carb ratio 1:10 units per meal or 4-6 units with food add correction of 2 units per 50 above 200 pt uses up to 20 units daily    . Insulin Glargine (LANTUS SOLOSTAR) 100 UNIT/ML Solostar Pen Inject 5 Units into the skin daily as needed.    Marland Kitchen levothyroxine (SYNTHROID) 175 MCG tablet Take 145 mcg by mouth daily before breakfast.     . lisinopril (ZESTRIL) 10 MG tablet Take 10 mg by mouth daily.     Marland Kitchen loratadine (CLARITIN) 10 MG tablet Take 10 mg by mouth daily.    . meloxicam (MOBIC) 7.5 MG tablet Take 7.5 mg by mouth 2 (two) times daily.    . pantoprazole (PROTONIX) 40 MG tablet Take 40 mg by mouth daily.    . Probiotic Product (ALIGN) 4 MG CAPS Take by mouth daily.    . clonazePAM (KLONOPIN) 1 MG tablet Take 1 mg by mouth 3 (three) times daily as needed for anxiety.    . Ibuprofen 200 MG CAPS Take 400 mg by mouth 2 (two) times daily as needed.     . metFORMIN (GLUCOPHAGE) 500 MG tablet Take 500 mg by mouth 2 (two) times daily with a meal.    . ondansetron (ZOFRAN) 4 MG tablet Take 1 tablet (4 mg total) by mouth every 8 (eight) hours as needed for nausea or vomiting. (Patient not taking: Reported on 04/25/2019) 30 tablet 0   No current facility-administered medications for  this visit.    REVIEW OF SYSTEMS:   Constitutional: Denies fevers, chills or abnormal night sweats Respiratory: Denies cough, dyspnea or wheezes Cardiovascular: Denies palpitation, chest discomfort or lower extremity swelling Gastrointestinal:  Denies nausea, heartburn or change in bowel habits Skin: Denies abnormal skin rashes Lymphatics: Denies new lymphadenopathy or easy bruising Neurological:Denies numbness, tingling or new weaknesses Behavioral/Psych: Mood is stable, no new changes  All other systems were reviewed with the patient and are negative.  PHYSICAL EXAMINATION: ECOG PERFORMANCE STATUS: 1 - Symptomatic but completely ambulatory  Vitals:   04/25/19 0929  BP: 113/67  Pulse: 78  Resp: 18  Temp: (!) 97.5 F (36.4 C)  SpO2: 97%   Filed Weights   04/25/19 0929  Weight: 165 lb (74.8 kg)    GENERAL:alert, no distress and comfortable SKIN: skin color, texture, turgor are normal, no rashes or significant lesions NECK: supple, thyroid normal size, non-tender, without nodularity LYMPH:  no palpable lymphadenopathy in the cervical, axillary or inguinal LUNGS: clear to auscultation and percussion with normal breathing effort HEART: regular rate & rhythm and no murmurs and no lower extremity edema ABDOMEN:abdomen soft, non-tender and normal bowel sounds Musculoskeletal:no cyanosis of digits and no clubbing  PSYCH: alert & oriented x 3 with fluent speech NEURO: no focal motor/sensory deficits  LABORATORY DATA:  I have reviewed the data as listed Recent Results (from the past 2160 hour(s))  Lactate dehydrogenase     Status: None   Collection Time: 04/25/19 12:08 PM  Result Value Ref Range   LDH 134 98 - 192 U/L    Comment: Performed at [facility], [address] [place], [place] [address]  CBC with Differential/Platelet     Status: Abnormal (Preliminary result)   Collection Time: 04/25/19 12:08 PM  Result Value Ref Range   WBC 14.9 (H) 4.0 - 10.5 K/uL   RBC 5.19  (H) 3.87 - 5.11 MIL/uL   Hemoglobin 15.1 (H) 12.0 - 15.0 g/dL   HCT 47.7 (H) 36.0 - 46.0 %   MCV 91.9 80.0 - 100.0 fL   MCH 29.1 26.0 - 34.0 pg   MCHC 31.7 30.0 - 36.0 g/dL   RDW 14.1 11.5 - 15.5 %   Platelets 398 150 - 400 K/uL   nRBC 0.0 0.0 - 0.2 %    Comment: Performed at [facility], [address] [place], [place] [address]   Neutrophils Relative % PENDING %   Neutro Abs PENDING 1.7 - 7.7 K/uL   Band Neutrophils PENDING %   Lymphocytes Relative PENDING %   Lymphs Abs PENDING 0.7 - 4.0 K/uL   Monocytes Relative PENDING %   Monocytes Absolute PENDING 0.1 - 1.0 K/uL   Eosinophils Relative PENDING %   Eosinophils Absolute PENDING 0.0 - 0.5 K/uL   Basophils Relative PENDING %   Basophils Absolute PENDING 0.0 - 0.1 K/uL   WBC Morphology PENDING    RBC Morphology PENDING    Smear Review PENDING    Other PENDING %   nRBC PENDING 0 /100 WBC   Metamyelocytes Relative PENDING %   Myelocytes PENDING %   Promyelocytes Relative PENDING %   Blasts PENDING %   Immature Granulocytes PENDING %   Abs Immature Granulocytes PENDING 0.00 - 0.07 K/uL  Comprehensive metabolic panel     Status: Abnormal   Collection Time: 04/25/19 12:08 PM  Result Value Ref Range   Sodium 140 135 - 145 mmol/L   Potassium 4.7 3.5 - 5.1 mmol/L   Chloride 106 98 - 111 mmol/L   CO2 24 22 - 32 mmol/L   Glucose, Bld 119 (H) 70 - 99 mg/dL   BUN 10 8 - 23 mg/dL   Creatinine, Ser 0.79 0.44 - 1.00 mg/dL   Calcium 10.1 8.9 - 10.3 mg/dL   Total Protein 7.3 6.5 - 8.1 g/dL   Albumin 4.0 3.5 - 5.0 g/dL   AST 23 15 - 41 U/L   ALT 21 0 - 44 U/L   Alkaline Phosphatase 63 38 - 126 U/L   Total Bilirubin 0.3 0.3 - 1.2 mg/dL   GFR calc non Af Amer >60 >60 mL/min   GFR calc Af Amer >60 >60 mL/min   Anion gap 10 5 - 15    Comment: Performed at [facility], [address], [place], [place] [address]  Sedimentation rate     Status: None   Collection Time: 04/25/19 12:08 PM  Result Value Ref Range   Sed Rate 3 0 - 22  mm/hr    Comment: Performed at [facility], [address] [place], [place] [address]  C-reactive protein     Status: None   Collection Time: 04/25/19 12:08 PM  Result Value Ref Range   CRP 0.9 <1.0 mg/dL    Comment: Performed at [facility], [address], [place], [place] [address]    RADIOGRAPHIC STUDIES: I have personally reviewed the radiological images as listed and agreed with the findings in the report. I have independently examined and elicited history from this patient.  I agree with the HPI written by my nurse practitioner Wenda Low, FNP.  I have independently formulated my assessment and plan which is as below.  ASSESSMENT & PLAN:  Leukocytosis 1.  Leukocytosis: -Patient seen at the request of Dr. Chalmers Cater  for elevated white count. -CBC on 04/02/2019 showed white count 15.6 with normal hemoglobin and platelet count.  Differential showed 61% neutrophils, 28% lymphocytes, 2.4% eosinophils and 7% monocytes. -Patient reports that she is aware that her white count has been high for the last several years.  She was treated with antibiotics multiple times.  She denies any chronic steroid use.  She is current active smoker.  Denies any prior history of splenectomy.  No prior history of autoimmune disorders. -Ultrasound of the abdomen from November 2018 showed increased hepatic echo texture most compatible with fatty infiltration.  No splenomegaly. -Denies any fevers, night sweats or weight loss.  No palpable adenopathy or splenomegaly. -We will repeat her CBC and review her smear.  Will check LDH, ANA and rheumatoid factor.  We will rule out myeloproliferative disorders by checking BCR/ABL by FISH and JAK2 [id number] mutation.  2.  Smoking history: -She smoked half to 2 packs/day for 28 years. -We have talked about benefits of low-dose CT scan for lung cancer screening.  She is agreeable.  Will order the scan.     All questions were answered. The patient knows to call the clinic with  any problems, questions or concerns.      Derek Jack, MD 04/25/19 5:46 PM

## 2019-04-25 NOTE — Assessment & Plan Note (Addendum)
1.  Leukocytosis: -Patient seen at the request of Dr. Chalmers Cater for elevated white count. -CBC on 04/02/2019 showed white count 15.6 with normal hemoglobin and platelet count.  Differential showed 61% neutrophils, 28% lymphocytes, 2.4% eosinophils and 7% monocytes. -Patient reports that she is aware that her white count has been high for the last several years.  She was treated with antibiotics multiple times.  She denies any chronic steroid use.  She is current active smoker.  Denies any prior history of splenectomy.  No prior history of autoimmune disorders. -Ultrasound of the abdomen from November 2018 showed increased hepatic echo texture most compatible with fatty infiltration.  No splenomegaly. -Denies any fevers, night sweats or weight loss.  No palpable adenopathy or splenomegaly. -We will repeat her CBC and review her smear.  Will check LDH, ANA and rheumatoid factor.  We will rule out myeloproliferative disorders by checking BCR/ABL by FISH and JAK2 V617F mutation.  2.  Smoking history: -She smoked half to 2 packs/day for 28 years. -We have talked about benefits of low-dose CT scan for lung cancer screening.  She is agreeable.  Will order the scan.

## 2019-04-25 NOTE — Progress Notes (Deleted)
CONSULT NOTE  Patient Care Team: Arsenio Katz, NP as PCP - General (Nurse Practitioner)  CHIEF COMPLAINTS/PURPOSE OF CONSULTATION:  ***  HISTORY OF PRESENTING ILLNESS:  Amber Hawkins [age] y.o. female is here because of ***  ***She was found to have abnormal CBC from *** ***She denies recent chest pain on exertion, shortness of breath on minimal exertion, pre-syncopal episodes, or palpitations. ***She had not noticed any recent bleeding such as epistaxis, hematuria or hematochezia ***The patient denies over the counter NSAID ingestion. She is not *** on antiplatelets agents. Her last colonoscopy was *** ***She had no prior history or diagnosis of cancer. Her age appropriate screening programs are up-to-date. ***She denies any pica and eats a variety of diet. ***She never donated blood or received blood transfusion ***The patient was prescribed oral iron supplements and she takes ***  MEDICAL HISTORY:  Past Medical History:  Diagnosis Date  . Anxiety   . CVA (cerebral vascular accident) ([place])    right sided weakness, with ling term memory loss.  . Diabetes mellitus type II, controlled ([place])   . Fibromyalgia   . GERD (gastroesophageal reflux disease)   . Hypercholesteremia   . Hypertension   . Hypothyroidism   . Neuropathy     SURGICAL HISTORY: Past Surgical History:  Procedure Laterality Date  . ABDOMINAL HYSTERECTOMY    . APPENDECTOMY    . BIOPSY  10/20/2018   Procedure: BIOPSY;  Surgeon: Rogene Houston, MD;  [facility];  Service: Endoscopy;;  gastric  . CHOLECYSTECTOMY    . COLONOSCOPY WITH PROPOFOL N/A 10/20/2018   Procedure: COLONOSCOPY WITH PROPOFOL;  Surgeon: Rogene Houston, MD;  [facility];  Service: Endoscopy;  Laterality: N/A;  . ESOPHAGOGASTRODUODENOSCOPY (EGD) WITH PROPOFOL N/A 10/20/2018   Procedure: ESOPHAGOGASTRODUODENOSCOPY (EGD) WITH PROPOFOL;  Surgeon: Rogene Houston, MD;  [facility];  Service: Endoscopy;   Laterality: N/A;  1220  . POLYPECTOMY  10/20/2018   Procedure: POLYPECTOMY;  Surgeon: Rogene Houston, MD;  [facility];  Service: Endoscopy;;  colon   . TONSILLECTOMY    . TUBAL LIGATION      SOCIAL HISTORY: Social History   Socioeconomic History  . Marital status: Married    Spouse name: Jeneen Rinks   . Number of children: 1  . Years of education: Not on file  . Highest education level: Not on file  Occupational History  . Occupation: unemployed  Tobacco Use  . Smoking status: Former Smoker    Packs/day: 0.25    Years: 4.00    Pack years: 1.00    Types: Cigarettes    Quit date: 10/18/2010    Years since quitting: 8.5  . Smokeless tobacco: Never Used  Substance and Sexual Activity  . Alcohol use: Not Currently  . Drug use: Never  . Sexual activity: Not Currently    Birth control/protection: Surgical  Other Topics Concern  . Not on file  Social History Narrative  . Not on file   Social Determinants of Health   Financial Resource Strain:   . Difficulty of Paying Living Expenses: Not on file  Food Insecurity:   . Worried About Charity fundraiser in the Last Year: Not on file  . Ran Out of Food in the Last Year: Not on file  Transportation Needs:   . Lack of Transportation (Medical): Not on file  . Lack of Transportation (Non-Medical): Not on file  Physical Activity:   . Days of Exercise per  Week: Not on file  . Minutes of Exercise per Session: Not on file  Stress:   . Feeling of Stress : Not on file  Social Connections:   . Frequency of Communication with Friends and Family: Not on file  . Frequency of Social Gatherings with Friends and Family: Not on file  . Attends Religious Services: Not on file  . Active Member of Clubs or Organizations: Not on file  . Attends Archivist Meetings: Not on file  . Marital Status: Not on file  Intimate Partner Violence:   . Fear of Current or Ex-Partner: Not on file  . Emotionally Abused: Not on file  .  Physically Abused: Not on file  . Sexually Abused: Not on file    FAMILY HISTORY: Family History  Problem Relation Age of Onset  . Colon cancer Mother   . Colon cancer Maternal Grandfather   . Colon cancer Other   . Microcephaly Father     ALLERGIES:  is allergic to baclofen.  MEDICATIONS:  Current Outpatient Medications  Medication Sig Dispense Refill  . aspirin 325 MG tablet Take 1 tablet (325 mg total) by mouth daily.    Marland Kitchen atorvastatin (LIPITOR) 40 MG tablet Take 40 mg by mouth daily.    Marland Kitchen b complex vitamins capsule Take 1 capsule by mouth daily.    . canagliflozin (INVOKANA) 100 MG TABS tablet Take 100 mg by mouth daily.    . carvedilol (COREG) 3.125 MG tablet Take 3.125 mg by mouth 2 (two) times daily with a meal.    . Cholecalciferol (VITAMIN D3) 1.25 MG (50000 UT) CAPS Take 1 capsule by mouth once a week.    . dicyclomine (BENTYL) 10 MG capsule TAKE 1 CAPSULE (10 MG TOTAL) BY MOUTH 3 (THREE) TIMES DAILY BEFORE MEALS. 90 capsule 5  . DULoxetine (CYMBALTA) 20 MG capsule Take 20 mg by mouth daily.    . fenofibrate (TRICOR) 145 MG tablet Take 145 mg by mouth daily.    . folic acid (FOLVITE) [id number] MCG tablet Take 400 mcg by mouth daily.    Marland Kitchen gabapentin (NEURONTIN) 300 MG capsule Take 300 mg by mouth 3 (three) times daily.    . hydrochlorothiazide (HYDRODIURIL) 25 MG tablet Take 25 mg by mouth daily.    . insulin aspart (NOVOLOG FLEXPEN) 100 UNIT/ML FlexPen Inject 1-20 Units into the skin 3 (three) times daily as needed. Patient uses a carb ratio 1:10 units per meal or 4-6 units with food add correction of 2 units per 50 above 200 pt uses up to 20 units daily    . Insulin Glargine (LANTUS SOLOSTAR) 100 UNIT/ML Solostar Pen Inject 5 Units into the skin daily as needed.    Marland Kitchen levothyroxine (SYNTHROID) 175 MCG tablet Take 145 mcg by mouth daily before breakfast.     . lisinopril (ZESTRIL) 10 MG tablet Take 10 mg by mouth daily.     Marland Kitchen loratadine (CLARITIN) 10 MG tablet Take 10 mg by mouth  daily.    . meloxicam (MOBIC) 7.5 MG tablet Take 7.5 mg by mouth 2 (two) times daily.    . pantoprazole (PROTONIX) 40 MG tablet Take 40 mg by mouth daily.    . Probiotic Product (ALIGN) 4 MG CAPS Take by mouth daily.    . clonazePAM (KLONOPIN) 1 MG tablet Take 1 mg by mouth 3 (three) times daily as needed for anxiety.    . Ibuprofen 200 MG CAPS Take 400 mg by mouth 2 (two) times daily as  needed.     . metFORMIN (GLUCOPHAGE) 500 MG tablet Take 500 mg by mouth 2 (two) times daily with a meal.    . ondansetron (ZOFRAN) 4 MG tablet Take 1 tablet (4 mg total) by mouth every 8 (eight) hours as needed for nausea or vomiting. (Patient not taking: Reported on 04/25/2019) 30 tablet 0   No current facility-administered medications for this visit.    REVIEW OF SYSTEMS:   Constitutional: Denies fevers, chills or abnormal night sweats Eyes: Denies blurriness of vision, double vision or watery eyes Ears, nose, mouth, throat, and face: Denies mucositis or sore throat Respiratory: Denies cough, dyspnea or wheezes Cardiovascular: Denies palpitation, chest discomfort or lower extremity swelling Gastrointestinal:  Denies nausea, heartburn or change in bowel habits Skin: Denies abnormal skin rashes Lymphatics: Denies new lymphadenopathy or easy bruising Neurological:Denies numbness, tingling or new weaknesses Behavioral/Psych: Mood is stable, no new changes  All other systems were reviewed with the patient and are negative.  PHYSICAL EXAMINATION: ECOG PERFORMANCE STATUS: {CHL ONC ECOG [id number]  Vitals:   04/25/19 0929  BP: 113/67  Pulse: 78  Resp: 18  Temp: (!) 97.5 F (36.4 C)  SpO2: 97%   Filed Weights   04/25/19 0929  Weight: 165 lb (74.8 kg)    GENERAL:alert, no distress and comfortable SKIN: skin color, texture, turgor are normal, no rashes or significant lesions EYES: normal, conjunctiva are pink and non-injected, sclera clear OROPHARYNX:no exudate, no erythema and lips, buccal  mucosa, and tongue normal  NECK: supple, thyroid normal size, non-tender, without nodularity LYMPH:  no palpable lymphadenopathy in the cervical, axillary or inguinal LUNGS: clear to auscultation and percussion with normal breathing effort HEART: regular rate & rhythm and no murmurs and no lower extremity edema ABDOMEN:abdomen soft, non-tender and normal bowel sounds Musculoskeletal:no cyanosis of digits and no clubbing  PSYCH: alert & oriented x 3 with fluent speech NEURO: no focal motor/sensory deficits  LABORATORY DATA:  I have reviewed the data as listed No results found for this or any previous visit (from the past 2160 hour(s)).  RADIOGRAPHIC STUDIES: I have personally reviewed the radiological images as listed and agreed with the findings in the report. No results found.  ASSESSMENT & PLAN:  No problem-specific Assessment & Plan notes found for this encounter.     All questions were answered. The patient knows to call the clinic with any problems, questions or concerns. I spent {CHL ONC TIME VISIT - [id number] counseling the patient face to face. The total time spent in the appointment was {CHL ONC TIME VISIT - [id number] and more than 50% was on counseling.     Donia Ast, LPN [id number]

## 2019-04-26 LAB — SURGICAL PATHOLOGY

## 2019-04-26 LAB — ANTINUCLEAR ANTIBODIES, IFA: ANA Ab, IFA: NEGATIVE

## 2019-04-26 LAB — RHEUMATOID FACTOR: Rheumatoid fact SerPl-aCnc: <10 IU/mL (ref 0.0–13.9)

## 2019-04-30 ENCOUNTER — Encounter (HOSPITAL_COMMUNITY): Payer: Self-pay | Admitting: *Deleted

## 2019-05-02 ENCOUNTER — Ambulatory Visit (HOSPITAL_COMMUNITY): Payer: 59

## 2019-05-03 LAB — BCR-ABL1 FISH
Cells Analyzed: 200
Cells Counted: 200

## 2019-05-04 LAB — CALR + JAK2 E12-15 + MPL (REFLEXED)

## 2019-05-04 LAB — JAK2 V617F, W REFLEX TO CALR/E12/MPL

## 2019-05-08 ENCOUNTER — Other Ambulatory Visit: Payer: Self-pay

## 2019-05-08 ENCOUNTER — Ambulatory Visit (HOSPITAL_COMMUNITY)
Admission: RE | Admit: 2019-05-08 | Discharge: 2019-05-08 | Disposition: A | Discharge status: Home / Self Care | Payer: 59 | Source: Ambulatory Visit | Attending: Nurse Practitioner | Admitting: Nurse Practitioner

## 2019-05-08 DIAGNOSIS — Z87891 Personal history of nicotine dependence: Secondary | ICD-10-CM | POA: Diagnosis present

## 2019-05-08 DIAGNOSIS — Z122 Encounter for screening for malignant neoplasm of respiratory organs: Secondary | ICD-10-CM | POA: Insufficient documentation

## 2019-05-09 ENCOUNTER — Ambulatory Visit (HOSPITAL_COMMUNITY): Payer: 59 | Admitting: Hematology

## 2019-05-10 ENCOUNTER — Encounter (HOSPITAL_COMMUNITY): Payer: Self-pay | Admitting: *Deleted

## 2019-05-10 NOTE — Progress Notes (Signed)
Patient notified via telephone of LDCT lung cancer screening results with recommendations to follow up in 12 months. Also notified of incidental findings and need to follow up with PCP.  I will mail her a copy of the results.  Patient's referring provider was sent a copy of results.    IMPRESSION: 1. Lung-RADS 2S, benign appearance or behavior. Continue annual screening with low-dose chest CT without contrast in 12 months. 2. The "S" modifier above refers to potentially clinically significant non lung cancer related findings. Specifically, there is aortic atherosclerosis, in addition to left main and 3 vessel coronary artery disease. Please note that although the presence of coronary artery calcium documents the presence of coronary artery disease, the severity of this disease and any potential stenosis cannot be assessed on this non-gated CT examination. Assessment for potential risk factor modification, dietary therapy or pharmacologic therapy may be warranted, if clinically indicated. 3. Mild diffuse bronchial wall thickening with mild centrilobular and paraseptal emphysema; imaging findings suggestive of underlying COPD. 4. Hepatic steatosis. 5. 3 mm nonobstructive calculus in the upper pole collecting system of left kidney. A  Aortic Atherosclerosis (ICD10-I70.0) and Emphysema (ICD10-J43.9).

## 2019-05-14 ENCOUNTER — Inpatient Hospital Stay (HOSPITAL_COMMUNITY): Payer: 59 | Attending: Hematology | Admitting: Hematology

## 2019-05-14 ENCOUNTER — Other Ambulatory Visit: Payer: Self-pay

## 2019-05-14 VITALS — BP 136/59 | HR 85 | Temp 96.8°F | Resp 18 | Wt 167.0 lb

## 2019-05-14 DIAGNOSIS — R42 Dizziness and giddiness: Secondary | ICD-10-CM | POA: Insufficient documentation

## 2019-05-14 DIAGNOSIS — R0609 Other forms of dyspnea: Secondary | ICD-10-CM | POA: Insufficient documentation

## 2019-05-14 DIAGNOSIS — N951 Menopausal and female climacteric states: Secondary | ICD-10-CM | POA: Insufficient documentation

## 2019-05-14 DIAGNOSIS — D72829 Elevated white blood cell count, unspecified: Secondary | ICD-10-CM | POA: Insufficient documentation

## 2019-05-14 DIAGNOSIS — F17210 Nicotine dependence, cigarettes, uncomplicated: Secondary | ICD-10-CM | POA: Insufficient documentation

## 2019-05-14 NOTE — Patient Instructions (Addendum)
[place] at [facility] Discharge Instructions  You were seen today by Dr. Delton Coombes. He went over your recent lab results. The leukemia blood work that we check for came back negative. Your white blood cell count is still elevated and could be caused by your smoking. We will continue to monitor your blood work. He will see you back in 4 months for labs and follow up.   Thank you for choosing [place] at [facility] to provide your oncology and hematology care.  To afford each patient quality time with our provider, please arrive at least 15 minutes before your scheduled appointment time.   If you have a lab appointment with the [facility] please come in thru the  Main Entrance and check in at the main information desk  You need to re-schedule your appointment should you arrive 10 or more minutes late.  We strive to give you quality time with our providers, and arriving late affects you and other patients whose appointments are after yours.  Also, if you no show three or more times for appointments you may be dismissed from the clinic at the providers discretion.     Again, thank you for choosing [facility].  Our hope is that these requests will decrease the amount of time that you wait before being seen by our physicians.       _____________________________________________________________  Should you have questions after your visit to [facility], please contact our office at [phone] between the hours of 8:00 a.m. and 4:30 p.m.  Voicemails left after 4:00 p.m. will not be returned until the following business day.  For prescription refill requests, have your pharmacy contact our office and allow 72 hours.    Cancer Center Support Programs:   > Cancer Support Group  2nd Tuesday of the month 1pm-2pm, Journey Room

## 2019-05-14 NOTE — Progress Notes (Signed)
Amber Hawkins, [place] [address]   CLINIC:  Medical Oncology/Hematology  PCP:  Arsenio Katz, NP [place] [address] [phone]   REASON FOR VISIT:  Follow-up for leukocytosis.  CURRENT THERAPY: Observation.   INTERVAL HISTORY:  Amber Hawkins 64 y.o. female seen for follow-up of leukocytosis.  She is current active smoker.  Reports hot flashes for the past 2 to 3 years.  Appetite is 100%.  Energy levels are low.  Shortness of breath on exertion present.  Reports dizziness at times.  No recent fevers or infections noted.    REVIEW OF SYSTEMS:  Review of Systems  Respiratory: Positive for shortness of breath.   Endocrine: Positive for hot flashes.  Neurological: Positive for dizziness.  All other systems reviewed and are negative.    PAST MEDICAL/SURGICAL HISTORY:  Past Medical History:  Diagnosis Date  . Anxiety   . CVA (cerebral vascular accident) ([place])    right sided weakness, with ling term memory loss.  . Diabetes mellitus type II, controlled ([place])   . Fibromyalgia   . GERD (gastroesophageal reflux disease)   . Hypercholesteremia   . Hypertension   . Hypothyroidism   . Neuropathy    Past Surgical History:  Procedure Laterality Date  . ABDOMINAL HYSTERECTOMY    . APPENDECTOMY    . BIOPSY  10/20/2018   Procedure: BIOPSY;  Surgeon: Rogene Houston, MD;  [facility];  Service: Endoscopy;;  gastric  . CHOLECYSTECTOMY    . COLONOSCOPY WITH PROPOFOL N/A 10/20/2018   Procedure: COLONOSCOPY WITH PROPOFOL;  Surgeon: Rogene Houston, MD;  [facility];  Service: Endoscopy;  Laterality: N/A;  . ESOPHAGOGASTRODUODENOSCOPY (EGD) WITH PROPOFOL N/A 10/20/2018   Procedure: ESOPHAGOGASTRODUODENOSCOPY (EGD) WITH PROPOFOL;  Surgeon: Rogene Houston, MD;  [facility];  Service: Endoscopy;  Laterality: N/A;  1220  . POLYPECTOMY  10/20/2018   Procedure: POLYPECTOMY;  Surgeon: Rogene Houston, MD;   [facility];  Service: Endoscopy;;  colon   . TONSILLECTOMY    . TUBAL LIGATION       SOCIAL HISTORY:  Social History   Socioeconomic History  . Marital status: Married    Spouse name: Jeneen Rinks   . Number of children: 1  . Years of education: Not on file  . Highest education level: Not on file  Occupational History  . Occupation: unemployed  Tobacco Use  . Smoking status: Former Smoker    Packs/day: 0.25    Years: 4.00    Pack years: 1.00    Types: Cigarettes    Quit date: 10/18/2010    Years since quitting: 8.5  . Smokeless tobacco: Never Used  Substance and Sexual Activity  . Alcohol use: Not Currently  . Drug use: Never  . Sexual activity: Not Currently    Birth control/protection: Surgical  Other Topics Concern  . Not on file  Social History Narrative  . Not on file   Social Determinants of Health   Financial Resource Strain:   . Difficulty of Paying Living Expenses: Not on file  Food Insecurity:   . Worried About Charity fundraiser in the Last Year: Not on file  . Ran Out of Food in the Last Year: Not on file  Transportation Needs:   . Lack of Transportation (Medical): Not on file  . Lack of Transportation (Non-Medical): Not on file  Physical Activity:   . Days of Exercise per Week:  Not on file  . Minutes of Exercise per Session: Not on file  Stress:   . Feeling of Stress : Not on file  Social Connections:   . Frequency of Communication with Friends and Family: Not on file  . Frequency of Social Gatherings with Friends and Family: Not on file  . Attends Religious Services: Not on file  . Active Member of Clubs or Organizations: Not on file  . Attends Archivist Meetings: Not on file  . Marital Status: Not on file  Intimate Partner Violence:   . Fear of Current or Ex-Partner: Not on file  . Emotionally Abused: Not on file  . Physically Abused: Not on file  . Sexually Abused: Not on file    FAMILY HISTORY:  Family History    Problem Relation Age of Onset  . Colon cancer Mother   . Colon cancer Maternal Grandfather   . Colon cancer Other   . Microcephaly Father     CURRENT MEDICATIONS:  Outpatient Encounter Medications as of 05/14/2019  Medication Sig Note  . aspirin 325 MG tablet Take 1 tablet (325 mg total) by mouth daily.   Marland Kitchen atorvastatin (LIPITOR) 40 MG tablet Take 40 mg by mouth daily.   Marland Kitchen b complex vitamins capsule Take 1 capsule by mouth daily.   . canagliflozin (INVOKANA) 100 MG TABS tablet Take 100 mg by mouth daily.   . carvedilol (COREG) 3.125 MG tablet Take 3.125 mg by mouth 2 (two) times daily with a meal.   . Cholecalciferol (VITAMIN D3) 1.25 MG (50000 UT) CAPS Take 1 capsule by mouth once a week.   . dicyclomine (BENTYL) 10 MG capsule TAKE 1 CAPSULE (10 MG TOTAL) BY MOUTH 3 (THREE) TIMES DAILY BEFORE MEALS.   . DULoxetine (CYMBALTA) 20 MG capsule Take 20 mg by mouth daily.   . fenofibrate (TRICOR) 145 MG tablet Take 145 mg by mouth daily.   . folic acid (FOLVITE) 536 MCG tablet Take 400 mcg by mouth daily.   Marland Kitchen gabapentin (NEURONTIN) 300 MG capsule Take 300 mg by mouth 3 (three) times daily.   . hydrochlorothiazide (HYDRODIURIL) 25 MG tablet Take 25 mg by mouth daily.   . insulin aspart (NOVOLOG FLEXPEN) 100 UNIT/ML FlexPen Inject 1-20 Units into the skin 3 (three) times daily as needed. Patient uses a carb ratio 1:10 units per meal or 4-6 units with food add correction of 2 units per 50 above 200 pt uses up to 20 units daily   . Insulin Glargine (LANTUS SOLOSTAR) 100 UNIT/ML Solostar Pen Inject 5 Units into the skin daily as needed. 10/18/2018: Per patient only takes as needed if blood glucose is elevated  . levothyroxine (SYNTHROID) 175 MCG tablet Take 145 mcg by mouth daily before breakfast.    . lisinopril (ZESTRIL) 10 MG tablet Take 10 mg by mouth daily.    Marland Kitchen loratadine (CLARITIN) 10 MG tablet Take 10 mg by mouth daily.   . meloxicam (MOBIC) 7.5 MG tablet Take 7.5 mg by mouth 2 (two) times  daily.   . metFORMIN (GLUCOPHAGE) 500 MG tablet Take 500 mg by mouth 2 (two) times daily with a meal.   . pantoprazole (PROTONIX) 40 MG tablet Take 40 mg by mouth daily.   . Probiotic Product (ALIGN) 4 MG CAPS Take by mouth daily.   . clonazePAM (KLONOPIN) 1 MG tablet Take 1 mg by mouth 3 (three) times daily as needed for anxiety.   . Ibuprofen 200 MG CAPS Take 400 mg by  mouth 2 (two) times daily as needed.    . ondansetron (ZOFRAN) 4 MG tablet Take 1 tablet (4 mg total) by mouth every 8 (eight) hours as needed for nausea or vomiting. (Patient not taking: Reported on 04/25/2019)    No facility-administered encounter medications on file as of 05/14/2019.    ALLERGIES:  Allergies  Allergen Reactions  . Baclofen Nausea And Vomiting     PHYSICAL EXAM:  ECOG Performance status: 1  Vitals:   05/14/19 1606  BP: (!) 136/59  Pulse: 85  Resp: 18  Temp: (!) 96.8 F (36 C)  SpO2: 98%   Filed Weights   05/14/19 1606  Weight: 167 lb (75.8 kg)    Physical Exam Vitals reviewed.  Constitutional:      Appearance: Normal appearance.  Pulmonary:     Effort: Pulmonary effort is normal.     Breath sounds: Normal breath sounds.  Abdominal:     Palpations: Abdomen is soft. There is no mass.  Skin:    General: Skin is warm.  Neurological:     General: No focal deficit present.     Mental Status: She is alert and oriented to person, place, and time.  Psychiatric:        Mood and Affect: Mood normal.        Behavior: Behavior normal.      LABORATORY DATA:  I have reviewed the labs as listed.  CBC    Component Value Date/Time   WBC 14.9 (H) 04/25/2019 1208   RBC 5.19 (H) 04/25/2019 1208   HGB 15.1 (H) 04/25/2019 1208   HCT 47.7 (H) 04/25/2019 1208   PLT 398 04/25/2019 1208   MCV 91.9 04/25/2019 1208   MCH 29.1 04/25/2019 1208   MCHC 31.7 04/25/2019 1208   RDW 14.1 04/25/2019 1208   LYMPHSABS 4.6 (H) 04/25/2019 1208   MONOABS 1.0 04/25/2019 1208   EOSABS 0.4 04/25/2019 1208    BASOSABS 0.1 04/25/2019 1208   CMP Latest Ref Rng & Units 04/25/2019 10/18/2018 07/29/2008  Glucose 70 - 99 mg/dL 119(H) 129(H) 119(H)  BUN 8 - 23 mg/dL 10 9 4(L)  Creatinine 0.44 - 1.00 mg/dL 0.79 0.59 0.53  Sodium 135 - 145 mmol/L 140 142 138  Potassium 3.5 - 5.1 mmol/L 4.7 3.9 4.4  Chloride 98 - 111 mmol/L 106 108 105  CO2 22 - 32 mmol/L '24 22 27  ' Calcium 8.9 - 10.3 mg/dL 10.1 9.6 9.1  Total Protein 6.5 - 8.1 g/dL 7.3 - -  Total Bilirubin 0.3 - 1.2 mg/dL 0.3 - -  Alkaline Phos 38 - 126 U/L 63 - -  AST 15 - 41 U/L 23 - -  ALT 0 - 44 U/L 21 - -       DIAGNOSTIC IMAGING:  I have independently reviewed the scans and discussed with the patient.    ASSESSMENT & PLAN:   Leukocytosis 1.  JAK2 V617F/BCR ABL negative leukocytosis: -She was worked up for leukocytosis with CBC on 04/02/2019 showing white count of 15.6 with normal differential. -She is current active smoker. -She denies any chronic systemic steroid use.  Denies any splenectomy.  She was treated with antibiotics for her elevated white count several times over the last few years. -We reviewed results of blood work from 04/25/2019.  White count is 14.9 with hemoglobin of 15.1 and hematocrit of 47.7.  Platelet count is 398.  LDH and CRP were normal.  Differential showed elevated absolute neutrophil count and lymphocytes.  ANA and rheumatoid  factor was negative. -BCR/ABL was negative by FISH.  JAK2 V6 66F and reflex testing was negative. -Flow cytometry was negative for any lymphoproliferative disorders. -Most likely explanation is smoking-related leukocytosis.  I do not believe a bone marrow biopsy is necessary at this time. -We will see her back in 4 months with repeat blood work.  If there is any significant change in the white count or other blood counts, will consider bone marrow biopsy.  2.  Tobacco abuse: -He smoked half to 2 packs/day for 28 years. -We reviewed results of low-dose CT scan of the lung-lung RADS 2S.   Hepatic steatosis was seen.      Orders placed this encounter:  Orders Placed This Encounter  Procedures  . CBC with Differential  . Lactate dehydrogenase      Derek Jack, MD [place] [phone]

## 2019-05-14 NOTE — Assessment & Plan Note (Signed)
1.  JAK2 V666F/BCR ABL negative leukocytosis: -She was worked up for leukocytosis with CBC on 04/02/2019 showing white count of 15.6 with normal differential. -She is current active smoker. -She denies any chronic systemic steroid use.  Denies any splenectomy.  She was treated with antibiotics for her elevated white count several times over the last few years. -We reviewed results of blood work from 04/25/2019.  White count is 14.9 with hemoglobin of 15.1 and hematocrit of 47.7.  Platelet count is 398.  LDH and CRP were normal.  Differential showed elevated absolute neutrophil count and lymphocytes.  ANA and rheumatoid factor was negative. -BCR/ABL was negative by FISH.  JAK2 V6 66F and reflex testing was negative. -Flow cytometry was negative for any lymphoproliferative disorders. -Most likely explanation is smoking-related leukocytosis.  I do not believe a bone marrow biopsy is necessary at this time. -We will see her back in 4 months with repeat blood work.  If there is any significant change in the white count or other blood counts, will consider bone marrow biopsy.  2.  Tobacco abuse: -He smoked half to 2 packs/day for 28 years. -We reviewed results of low-dose CT scan of the lung-lung RADS 2S.  Hepatic steatosis was seen.

## 2019-06-22 ENCOUNTER — Other Ambulatory Visit (INDEPENDENT_AMBULATORY_CARE_PROVIDER_SITE_OTHER): Payer: Self-pay | Admitting: Internal Medicine

## 2019-09-12 ENCOUNTER — Other Ambulatory Visit (HOSPITAL_COMMUNITY): Payer: 59

## 2019-09-17 ENCOUNTER — Other Ambulatory Visit: Payer: Self-pay

## 2019-09-17 ENCOUNTER — Inpatient Hospital Stay (HOSPITAL_COMMUNITY): Payer: 59 | Attending: Hematology

## 2019-09-17 DIAGNOSIS — R300 Dysuria: Secondary | ICD-10-CM | POA: Diagnosis not present

## 2019-09-17 DIAGNOSIS — R319 Hematuria, unspecified: Secondary | ICD-10-CM | POA: Diagnosis not present

## 2019-09-17 DIAGNOSIS — D72829 Elevated white blood cell count, unspecified: Secondary | ICD-10-CM

## 2019-09-17 LAB — CBC WITH DIFFERENTIAL/PLATELET
Abs Immature Granulocytes: 0.04 10*3/uL (ref 0.00–0.07)
Basophils Absolute: 0.1 10*3/uL (ref 0.0–0.1)
Basophils Relative: 1 %
Eosinophils Absolute: 0.5 10*3/uL (ref 0.0–0.5)
Eosinophils Relative: 4 %
HCT: 47.3 % — ABNORMAL HIGH (ref 36.0–46.0)
Hemoglobin: 15 g/dL (ref 12.0–15.0)
Immature Granulocytes: 0 %
Lymphocytes Relative: 32 %
Lymphs Abs: 4.2 10*3/uL — ABNORMAL HIGH (ref 0.7–4.0)
MCH: 29.1 pg (ref 26.0–34.0)
MCHC: 31.7 g/dL (ref 30.0–36.0)
MCV: 91.7 fL (ref 80.0–100.0)
Monocytes Absolute: 0.9 10*3/uL (ref 0.1–1.0)
Monocytes Relative: 7 %
Neutro Abs: 7.4 10*3/uL (ref 1.7–7.7)
Neutrophils Relative %: 56 %
Platelets: 396 10*3/uL (ref 150–400)
RBC: 5.16 MIL/uL — ABNORMAL HIGH (ref 3.87–5.11)
RDW: 13.7 % (ref 11.5–15.5)
WBC: 13.1 10*3/uL — ABNORMAL HIGH (ref 4.0–10.5)
nRBC: 0 % (ref 0.0–0.2)

## 2019-09-17 LAB — LACTATE DEHYDROGENASE: LDH: 113 U/L (ref 98–192)

## 2019-09-19 ENCOUNTER — Ambulatory Visit (HOSPITAL_COMMUNITY): Payer: 59 | Admitting: Hematology

## 2019-09-24 ENCOUNTER — Other Ambulatory Visit: Payer: Self-pay

## 2019-09-24 ENCOUNTER — Inpatient Hospital Stay (HOSPITAL_BASED_OUTPATIENT_CLINIC_OR_DEPARTMENT_OTHER): Payer: 59 | Admitting: Hematology

## 2019-09-24 VITALS — BP 109/73 | HR 92 | Temp 97.4°F | Resp 18 | Wt 164.3 lb

## 2019-09-24 DIAGNOSIS — D72829 Elevated white blood cell count, unspecified: Secondary | ICD-10-CM | POA: Diagnosis not present

## 2019-09-24 NOTE — Progress Notes (Signed)
Amber Hawkins, [place] [address]   CLINIC:  Medical Oncology/Hematology  PCP:  Arsenio Katz, NP [address] / [place] [address]  [phone]  REASON FOR VISIT:  Follow-up for leukocytosis  PRIOR THERAPY: None  CURRENT THERAPY: Observation  INTERVAL HISTORY:  Amber Hawkins, a 64 y.o. female, returns for routine follow-up for her leukocytosis. Amber Hawkins was last seen on 05/14/2019.  Today she is accompanied by her husband. She reports that she is being treated by Dr. Burr Medico for pyelonephritis and has been treated for over 1 month. She is still having dysuria and hematuria, but she denies having F/C.   REVIEW OF SYSTEMS:  Review of Systems  Constitutional: Positive for appetite change (moderately decreased) and fatigue (depleted). Negative for chills and fever.  Gastrointestinal: Positive for nausea (occasional) and vomiting (occasional).  Genitourinary: Positive for dysuria and hematuria.   Musculoskeletal: Positive for myalgias (8/10 over entire body).  Neurological: Positive for headaches and numbness (hands).  All other systems reviewed and are negative.   PAST MEDICAL/SURGICAL HISTORY:  Past Medical History:  Diagnosis Date  . Anxiety   . CVA (cerebral vascular accident) ([place])    right sided weakness, with ling term memory loss.  . Diabetes mellitus type II, controlled ([place])   . Fibromyalgia   . GERD (gastroesophageal reflux disease)   . Hypercholesteremia   . Hypertension   . Hypothyroidism   . Neuropathy    Past Surgical History:  Procedure Laterality Date  . ABDOMINAL HYSTERECTOMY    . APPENDECTOMY    . BIOPSY  10/20/2018   Procedure: BIOPSY;  Surgeon: Rogene Houston, MD;  [facility];  Service: Endoscopy;;  gastric  . CHOLECYSTECTOMY    . COLONOSCOPY WITH PROPOFOL N/A 10/20/2018   Procedure: COLONOSCOPY WITH PROPOFOL;  Surgeon: Rogene Houston, MD;  [facility]: [facility];  Service: Endoscopy;  Laterality:  N/A;  . ESOPHAGOGASTRODUODENOSCOPY (EGD) WITH PROPOFOL N/A 10/20/2018   Procedure: ESOPHAGOGASTRODUODENOSCOPY (EGD) WITH PROPOFOL;  Surgeon: Rogene Houston, MD;  [facility];  Service: Endoscopy;  Laterality: N/A;  1220  . POLYPECTOMY  10/20/2018   Procedure: POLYPECTOMY;  Surgeon: Rogene Houston, MD;  [facility];  Service: Endoscopy;;  colon   . TONSILLECTOMY    . TUBAL LIGATION      SOCIAL HISTORY:  Social History   Socioeconomic History  . Marital status: Married    Spouse name: Jeneen Rinks   . Number of children: 1  . Years of education: Not on file  . Highest education level: Not on file  Occupational History  . Occupation: unemployed  Tobacco Use  . Smoking status: Former Smoker    Packs/day: 0.25    Years: 4.00    Pack years: 1.00    Types: Cigarettes    Quit date: 10/18/2010    Years since quitting: 8.9  . Smokeless tobacco: Never Used  Vaping Use  . Vaping Use: Never used  Substance and Sexual Activity  . Alcohol use: Not Currently  . Drug use: Never  . Sexual activity: Not Currently    Birth control/protection: Surgical  Other Topics Concern  . Not on file  Social History Narrative  . Not on file   Social Determinants of Health   Financial Resource Strain:   . Difficulty of Paying Living Expenses:   Food Insecurity:   . Worried About Charity fundraiser in the Last Year:   . [employer]  of Food in the Last Year:   Transportation Needs:   . Film/video editor (Medical):   Marland Kitchen Lack of Transportation (Non-Medical):   Physical Activity:   . Days of Exercise per Week:   . Minutes of Exercise per Session:   Stress:   . Feeling of Stress :   Social Connections:   . Frequency of Communication with Friends and Family:   . Frequency of Social Gatherings with Friends and Family:   . Attends Religious Services:   . Active Member of Clubs or Organizations:   . Attends Archivist Meetings:   Marland Kitchen Marital Status:   Intimate Partner  Violence:   . Fear of Current or Ex-Partner:   . Emotionally Abused:   Marland Kitchen Physically Abused:   . Sexually Abused:     FAMILY HISTORY:  Family History  Problem Relation Age of Onset  . Colon cancer Mother   . Colon cancer Maternal Grandfather   . Colon cancer Other   . Microcephaly Father     CURRENT MEDICATIONS:  Current Outpatient Medications  Medication Sig Dispense Refill  . aspirin 325 MG tablet Take 1 tablet (325 mg total) by mouth daily.    Marland Kitchen atorvastatin (LIPITOR) 40 MG tablet Take 40 mg by mouth daily.    Marland Kitchen b complex vitamins capsule Take 1 capsule by mouth daily.    . canagliflozin (INVOKANA) 100 MG TABS tablet Take 100 mg by mouth daily.    . carvedilol (COREG) 3.125 MG tablet Take 3.125 mg by mouth 2 (two) times daily with a meal.    . Cholecalciferol (VITAMIN D3) 1.25 MG (50000 UT) CAPS Take 1 capsule by mouth once a week.    . clonazePAM (KLONOPIN) 1 MG tablet Take 1 mg by mouth 3 (three) times daily as needed for anxiety.    . dicyclomine (BENTYL) 10 MG capsule TAKE 1 CAPSULE (10 MG TOTAL) BY MOUTH 3 (THREE) TIMES DAILY BEFORE MEALS. 270 capsule 1  . DULoxetine (CYMBALTA) 20 MG capsule Take 20 mg by mouth daily.    . fenofibrate (TRICOR) 145 MG tablet Take 145 mg by mouth daily.    . folic acid (FOLVITE) 732 MCG tablet Take 400 mcg by mouth daily.    Marland Kitchen gabapentin (NEURONTIN) 300 MG capsule Take 300 mg by mouth 3 (three) times daily.    . hydrochlorothiazide (HYDRODIURIL) 25 MG tablet Take 25 mg by mouth daily.    . Ibuprofen 200 MG CAPS Take 400 mg by mouth 2 (two) times daily as needed.     . insulin aspart (NOVOLOG FLEXPEN) 100 UNIT/ML FlexPen Inject 1-20 Units into the skin 3 (three) times daily as needed. Patient uses a carb ratio 1:10 units per meal or 4-6 units with food add correction of 2 units per 50 above 200 pt uses up to 20 units daily    . Insulin Glargine (LANTUS SOLOSTAR) 100 UNIT/ML Solostar Pen Inject 5 Units into the skin daily as needed.    Marland Kitchen  levothyroxine (SYNTHROID) 175 MCG tablet Take 145 mcg by mouth daily before breakfast.     . lisinopril (ZESTRIL) 10 MG tablet Take 10 mg by mouth daily.     Marland Kitchen loratadine (CLARITIN) 10 MG tablet Take 10 mg by mouth daily.    . meloxicam (MOBIC) 7.5 MG tablet Take 7.5 mg by mouth 2 (two) times daily.    . metFORMIN (GLUCOPHAGE) 500 MG tablet Take 500 mg by mouth 2 (two) times daily with a meal.    .  ondansetron (ZOFRAN) 4 MG tablet Take 1 tablet (4 mg total) by mouth every 8 (eight) hours as needed for nausea or vomiting. (Patient not taking: Reported on 04/25/2019) 30 tablet 0  . pantoprazole (PROTONIX) 40 MG tablet Take 40 mg by mouth daily.    . Probiotic Product (ALIGN) 4 MG CAPS Take by mouth daily.     No current facility-administered medications for this visit.    ALLERGIES:  Allergies  Allergen Reactions  . Baclofen Nausea And Vomiting    PHYSICAL EXAM:  Performance status (ECOG): 1 - Symptomatic but completely ambulatory  Vitals:   09/24/19 1603  BP: 109/73  Pulse: 92  Resp: 18  Temp: (!) 97.4 F (36.3 C)  SpO2: 97%   Wt Readings from Last 3 Encounters:  09/24/19 164 lb 4.8 oz (74.5 kg)  05/14/19 167 lb (75.8 kg)  04/25/19 165 lb (74.8 kg)   Physical Exam Vitals reviewed.  Constitutional:      Appearance: Normal appearance.  Cardiovascular:     Rate and Rhythm: Normal rate and regular rhythm.     Pulses: Normal pulses.     Heart sounds: Normal heart sounds.  Pulmonary:     Effort: Pulmonary effort is normal.     Breath sounds: Normal breath sounds.  Neurological:     General: No focal deficit present.     Mental Status: She is alert and oriented to person, place, and time.  Psychiatric:        Mood and Affect: Mood normal.        Behavior: Behavior normal.     LABORATORY DATA:  I have reviewed the labs as listed.  CBC Latest Ref Rng & Units 09/17/2019 04/25/2019 07/30/2008  WBC 4.0 - 10.5 K/uL 13.1(H) 14.9(H) -  Hemoglobin 12.0 - 15.0 g/dL 15.0 15.1(H)  14.0  Hematocrit 36 - 46 % 47.3(H) 47.7(H) -  Platelets 150 - 400 K/uL 396 398 -   CMP Latest Ref Rng & Units 04/25/2019 10/18/2018 07/29/2008  Glucose 70 - 99 mg/dL 119(H) 129(H) 119(H)  BUN 8 - 23 mg/dL 10 9 4(L)  Creatinine 0.44 - 1.00 mg/dL 0.79 0.59 0.53  Sodium 135 - 145 mmol/L 140 142 138  Potassium 3.5 - 5.1 mmol/L 4.7 3.9 4.4  Chloride 98 - 111 mmol/L 106 108 105  CO2 22 - 32 mmol/L _0 Calcium 8.9 - 10.3 mg/dL 10.1 9.6 9.1  Total Protein 6.5 - 8.1 g/dL 7.3 - -  Total Bilirubin 0.3 - 1.2 mg/dL 0.3 - -  Alkaline Phos 38 - 126 U/L 63 - -  AST 15 - 41 U/L 23 - -  ALT 0 - 44 U/L 21 - -      Component Value Date/Time   RBC 5.16 (H) 09/17/2019 1111   MCV 91.7 09/17/2019 1111   MCH 29.1 09/17/2019 1111   MCHC 31.7 09/17/2019 1111   RDW 13.7 09/17/2019 1111   LYMPHSABS 4.2 (H) 09/17/2019 1111   MONOABS 0.9 09/17/2019 1111   EOSABS 0.5 09/17/2019 1111   BASOSABS 0.1 09/17/2019 1111   Lab Results  Component Value Date   LDH 113 09/17/2019   LDH 134 04/25/2019     DIAGNOSTIC IMAGING:  I have independently reviewed the scans and discussed with the patient. No results found.   ASSESSMENT:  1.  JAK2 V617F/BCR ABL negative leukocytosis: -She was worked up for leukocytosis with CBC on 04/02/2019 showing white count of 15.6 with normal differential. -She is current active smoker. -She  denies any chronic systemic steroid use.  Denies any splenectomy.  She was treated with antibiotics for her elevated white count several times over the last few years. -We reviewed results of blood work from 04/25/2019.  White count is 14.9 with hemoglobin of 15.1 and hematocrit of 47.7.  Platelet count is 398.  LDH and CRP were normal.  Differential showed elevated absolute neutrophil count and lymphocytes.  ANA and rheumatoid factor was negative. -BCR/ABL was negative by FISH.  JAK2 V6 60F and reflex testing was negative. -Flow cytometry was negative for any lymphoproliferative  disorders. -Most likely explanation is smoking-related leukocytosis.  I do not believe a bone marrow biopsy is necessary at this time.  2.  Tobacco abuse: -Smoked 1 and half to 2 packs/day for 28 years. -Low-dose CT scan on 05/08/2019 was lung RADS 2S.  Continue annual screening.   PLAN:  1.  JAK2 V660F/BCR ABL negative leukocytosis: -She does not have any vasomotor symptoms.  No aquagenic pruritus. -CBC on 09/17/2019 shows white count 13.1 and hemoglobin 50.1.  Hematocrit is 47.3 and platelet count of 396.  Differential shows increase in lymphocytes.  LDH was normal. -Physical exam was negative for splenomegaly.  We will monitor her in 6 months with repeat CBC and LDH.  2.  Tobacco abuse: -Tobacco abuse could be contributing to her leukocytosis.  She will need another screening CT scan in March of next year.   Orders placed this encounter:  No orders of the defined types were placed in this encounter.    Derek Jack, MD [place] [phone]   I, Milinda Antis, am acting as a scribe for Dr. Sanda Linger.  I, Derek Jack MD, have reviewed the above documentation for accuracy and completeness, and I agree with the above.

## 2019-09-24 NOTE — Patient Instructions (Signed)
[facility] Discharge Instructions  You were seen today by Dr. Katragadda. He went over your recent results. Dr. Katragadda will see you back in 6 months for labs and follow up.   Thank you for choosing  [facility] at [facility] to provide your oncology and hematology care.  To afford each patient quality time with our provider, please arrive at least 15 minutes before your scheduled appointment time.   If you have a lab appointment with the Cancer Center please come in thru the Main Entrance and check in at the main information desk  You need to re-schedule your appointment should you arrive 10 or more minutes late.  We strive to give you quality time with our providers, and arriving late affects you and other patients whose appointments are after yours.  Also, if you no show three or more times for appointments you may be dismissed from the clinic at the providers discretion.     Again, thank you for choosing [facility].  Our hope is that these requests will decrease the amount of time that you wait before being seen by our physicians.       _____________________________________________________________  Should you have questions after your visit to [facility], please contact our office at [phone] between the hours of 8:00 a.m. and 4:30 p.m.  Voicemails left after 4:00 p.m. will not be returned until the following business day.  For prescription refill requests, have your pharmacy contact our office and allow 72 hours.    Cancer Center Support Programs:   > Cancer Support Group  2nd Tuesday of the month 1pm-2pm, Journey Room    

## 2019-12-18 ENCOUNTER — Other Ambulatory Visit (INDEPENDENT_AMBULATORY_CARE_PROVIDER_SITE_OTHER): Payer: Self-pay | Admitting: Internal Medicine

## 2019-12-18 NOTE — Telephone Encounter (Signed)
Last seen 10/10/2018 by Esmond Plants.

## 2020-03-14 ENCOUNTER — Inpatient Hospital Stay (HOSPITAL_COMMUNITY): Payer: 59

## 2020-03-17 ENCOUNTER — Ambulatory Visit (HOSPITAL_COMMUNITY): Payer: 59 | Admitting: Hematology

## 2020-04-04 ENCOUNTER — Inpatient Hospital Stay (HOSPITAL_COMMUNITY): Payer: 59

## 2020-04-08 ENCOUNTER — Telehealth (HOSPITAL_COMMUNITY): Payer: 59 | Admitting: Oncology

## 2020-04-09 ENCOUNTER — Other Ambulatory Visit (HOSPITAL_COMMUNITY): Payer: 59

## 2020-04-10 ENCOUNTER — Telehealth (HOSPITAL_COMMUNITY): Payer: 59 | Admitting: Oncology

## 2020-04-18 ENCOUNTER — Other Ambulatory Visit: Payer: Self-pay

## 2020-04-18 ENCOUNTER — Inpatient Hospital Stay (HOSPITAL_COMMUNITY): Payer: 59 | Attending: Hematology

## 2020-04-18 DIAGNOSIS — F17210 Nicotine dependence, cigarettes, uncomplicated: Secondary | ICD-10-CM | POA: Insufficient documentation

## 2020-04-18 DIAGNOSIS — D72829 Elevated white blood cell count, unspecified: Secondary | ICD-10-CM | POA: Diagnosis not present

## 2020-04-18 LAB — CBC WITH DIFFERENTIAL/PLATELET
Abs Immature Granulocytes: 0.03 10*3/uL (ref 0.00–0.07)
Basophils Absolute: 0.1 10*3/uL (ref 0.0–0.1)
Basophils Relative: 1 %
Eosinophils Absolute: 0.5 10*3/uL (ref 0.0–0.5)
Eosinophils Relative: 5 %
HCT: 44 % (ref 36.0–46.0)
Hemoglobin: 14.3 g/dL (ref 12.0–15.0)
Immature Granulocytes: 0 %
Lymphocytes Relative: 32 %
Lymphs Abs: 3.4 10*3/uL (ref 0.7–4.0)
MCH: 29.3 pg (ref 26.0–34.0)
MCHC: 32.5 g/dL (ref 30.0–36.0)
MCV: 90.2 fL (ref 80.0–100.0)
Monocytes Absolute: 0.9 10*3/uL (ref 0.1–1.0)
Monocytes Relative: 8 %
Neutro Abs: 5.8 10*3/uL (ref 1.7–7.7)
Neutrophils Relative %: 54 %
Platelets: 370 10*3/uL (ref 150–400)
RBC: 4.88 MIL/uL (ref 3.87–5.11)
RDW: 13.6 % (ref 11.5–15.5)
WBC: 10.7 10*3/uL — ABNORMAL HIGH (ref 4.0–10.5)
nRBC: 0 % (ref 0.0–0.2)

## 2020-04-18 LAB — LACTATE DEHYDROGENASE: LDH: 107 U/L (ref 98–192)

## 2020-04-21 NOTE — Progress Notes (Signed)
Virtual Visit via telephone Note  I connected with Amber Hawkins on 04/22/20 at 10:30 AM EST by a video enabled telemedicine application and verified that I am speaking with the correct person using two identifiers.  Location: Patient: Home  Provider: Clinic    I discussed the limitations of evaluation and management by telemedicine and the availability of in person appointments. The patient expressed understanding and agreed to proceed.  History of Present Illness: [name] is a 65 year old female who is on observation for leukocytosis.  She was last seen in clinic on 09/24/2019.  She has a past medical history significant for hypertension, CVA, hypothyroidism, fibromyalgia, depression and anxiety, hyperlipidemia and incidental findings of leukocytosis.  Since her last visit, she has done well. She was treated and fully recovered from pyelonephritis. Occasional abdominal pain that resolves on its own. Appetite is good. Admits to having to rest in between activities.  Continues to smoke 3 to 4 cigarettes daily.  Overall feeling well.  Denies any new infections.     Observations/Objective: Review of Systems  Constitutional: Positive for malaise/fatigue. Negative for chills, fever and weight loss.  HENT: Negative for congestion, ear pain and tinnitus.   Eyes: Negative.  Negative for blurred vision and double vision.  Respiratory: Positive for shortness of breath (With exertion). Negative for cough and sputum production.   Cardiovascular: Negative.  Negative for chest pain, palpitations and leg swelling.  Gastrointestinal: Negative.  Negative for abdominal pain, constipation, diarrhea, nausea and vomiting.  Genitourinary: Negative for dysuria, frequency and urgency.  Musculoskeletal: Positive for myalgias. Negative for back pain and falls.  Skin: Negative.  Negative for rash.  Neurological: Positive for weakness. Negative for headaches.  Endo/Heme/Allergies: Negative.  Does not bruise/bleed  easily.  Psychiatric/Behavioral: Negative.  Negative for depression. The patient is not nervous/anxious and does not have insomnia.    Physical Exam Neurological:     Mental Status: She is oriented to person, place, and time.     Assessment and Plan: ASSESSMENT:  1.  JAK2 V642F/BCR ABL negative leukocytosis: -She was worked up for leukocytosis with CBC on 04/02/2019 showing white count of 15.6 with normal differential. -She is current active smoker. -She denies any chronic systemic steroid use.  Denies any splenectomy.  She was treated with antibiotics for her elevated white count several times over the last few years. -We reviewed results of blood work from 04/25/2019.  White count is 14.9 with hemoglobin of 15.1 and hematocrit of 47.7.  Platelet count is 398.  LDH and CRP were normal.  Differential showed elevated absolute neutrophil count and lymphocytes.  ANA and rheumatoid Hawkins was negative. -BCR/ABL was negative by FISH.  JAK2 V6 42F and reflex testing was negative. -Flow cytometry was negative for any lymphoproliferative disorders. -Most likely explanation is smoking-related leukocytosis.  I do not believe a bone marrow biopsy is necessary at this time.  2.  Tobacco abuse: -Smoked 1 and half to 2 packs/day for 28 years. -Low-dose CT scan on 05/08/2019 was lung RADS 2S.  Continue annual screening.  PLAN:  1.  JAK2 V642F/BCR ABL negative leukocytosis: -She does not have any vasomotor symptoms.  No aquagenic pruritus. -CBC on 04/18/2020 show a white count of 10.7 and hemoglobin 14.3.  Hematocrit is 44.  Platelet count is 370. -No splenomegaly on previous exam.  2.  Tobacco abuse: -Tobacco abuse could be contributing to her leukocytosis.  She will need another screening CT scan in March of next year.  Follow Up Instructions: Disposition: -Schedule  low-dose CT scan in March 2022. -We will monitor her in 6 months with repeat CBC and LDH and assessment.    I discussed the  assessment and treatment plan with the patient. The patient was provided an opportunity to ask questions and all were answered. The patient agreed with the plan and demonstrated an understanding of the instructions.   The patient was advised to call back or seek an in-person evaluation if the symptoms worsen or if the condition fails to improve as anticipated.  I provided 9 minutes of non-face-to-face time during this encounter.   Jacquelin Hawking, NP

## 2020-04-22 ENCOUNTER — Inpatient Hospital Stay (HOSPITAL_BASED_OUTPATIENT_CLINIC_OR_DEPARTMENT_OTHER): Payer: 59 | Admitting: Oncology

## 2020-04-22 ENCOUNTER — Other Ambulatory Visit: Payer: Self-pay

## 2020-04-22 DIAGNOSIS — D72829 Elevated white blood cell count, unspecified: Secondary | ICD-10-CM

## 2020-04-22 DIAGNOSIS — Z122 Encounter for screening for malignant neoplasm of respiratory organs: Secondary | ICD-10-CM | POA: Diagnosis not present

## 2020-04-22 DIAGNOSIS — Z87891 Personal history of nicotine dependence: Secondary | ICD-10-CM | POA: Diagnosis not present

## 2020-06-02 ENCOUNTER — Ambulatory Visit (HOSPITAL_COMMUNITY): Admission: RE | Admit: 2020-06-02 | Payer: 59 | Source: Ambulatory Visit

## 2020-06-09 ENCOUNTER — Encounter (HOSPITAL_COMMUNITY): Payer: Self-pay

## 2020-06-09 ENCOUNTER — Other Ambulatory Visit (INDEPENDENT_AMBULATORY_CARE_PROVIDER_SITE_OTHER): Payer: Self-pay

## 2020-06-09 MED ORDER — DICYCLOMINE HCL 10 MG PO CAPS: 10.0000 mg | ORAL_CAPSULE | Freq: Three times a day (TID) | ORAL | 0 refills | Status: DC

## 2020-06-09 NOTE — Progress Notes (Signed)
Patient did not complete follow-up LDCT appt on 06/02/2020. I have attempted to reach patient to reschedule. Unable to reach patient at this time, no VM available.

## 2020-08-05 ENCOUNTER — Encounter (HOSPITAL_COMMUNITY): Payer: Self-pay

## 2020-08-05 NOTE — Progress Notes (Signed)
Attempted to reach patient to reschedule LDCT. Unable to do so at this time. No VM available.

## 2020-09-18 ENCOUNTER — Other Ambulatory Visit: Payer: Self-pay

## 2020-09-18 ENCOUNTER — Encounter (INDEPENDENT_AMBULATORY_CARE_PROVIDER_SITE_OTHER): Payer: Self-pay

## 2020-09-18 ENCOUNTER — Ambulatory Visit (INDEPENDENT_AMBULATORY_CARE_PROVIDER_SITE_OTHER): Payer: 59 | Admitting: Gastroenterology

## 2020-09-18 ENCOUNTER — Telehealth (INDEPENDENT_AMBULATORY_CARE_PROVIDER_SITE_OTHER): Payer: Self-pay

## 2020-09-18 ENCOUNTER — Other Ambulatory Visit (INDEPENDENT_AMBULATORY_CARE_PROVIDER_SITE_OTHER): Payer: Self-pay

## 2020-09-18 ENCOUNTER — Encounter (INDEPENDENT_AMBULATORY_CARE_PROVIDER_SITE_OTHER): Payer: Self-pay | Admitting: Gastroenterology

## 2020-09-18 VITALS — BP 93/50 | HR 108 | Temp 97.9°F | Ht 64.0 in | Wt 161.6 lb

## 2020-09-18 DIAGNOSIS — K589 Irritable bowel syndrome without diarrhea: Secondary | ICD-10-CM | POA: Insufficient documentation

## 2020-09-18 DIAGNOSIS — R109 Unspecified abdominal pain: Secondary | ICD-10-CM

## 2020-09-18 DIAGNOSIS — K582 Mixed irritable bowel syndrome: Secondary | ICD-10-CM

## 2020-09-18 DIAGNOSIS — I639 Cerebral infarction, unspecified: Secondary | ICD-10-CM

## 2020-09-18 MED ORDER — PEG 3350-KCL-NA BICARB-NACL 420 G PO SOLR: 4000.0000 mL | ORAL | 0 refills | Status: DC

## 2020-09-18 MED ORDER — MAGNESIUM CITRATE PO SOLN: 1.0000 | Freq: Once | ORAL | 0 refills | Status: AC

## 2020-09-18 NOTE — Telephone Encounter (Signed)
[name], CMA  

## 2020-09-18 NOTE — Progress Notes (Signed)
[name], M.D. Gastroenterology & Hepatology [facility] [address] [place], [place] [address]  Primary Care Physician: Arsenio Katz, NP [place]  [id number]  I will communicate my assessment and recommendations to the referring MD via EMR.  Problems: IBS M  History of Present Illness: Amber Hawkins is a 65 y.o. female with past medical history of CVA, diabetes, fibromyalgia, GERD, hypertension, hyperlipidemia, hypothyroidism, anxiety and IBS, who presents for follow up of abdominal pain, intermittent diarrhea and constipation, nausea.  The patient was last seen on 10/10/2018. At that time, the patient was scheduled to undergo an EGD and colonoscopy for evaluation of epigastric pain. The patient underwent this procedure on 10/20/2018, esophagogastroduodenospy showed erythema, erosions and erythema in the gastric body (pathology negative for H. pylori or any other alterations), normal duodenum.  Colonoscopy showed presence of poor prep of the colon.  There was a small polyp in the hepatic flexure, 2 small polyps in the descending colon and 1 6 mm polyp in the rectosigmoid colon (pathology was consistent with tubular adenomas x2 and the rest were hyperplastic polyps).  Diverticulosis.  She was prescribed dicyclomine 10 mg 3 times daily for management of possible IBS.  The patient is a poor historian.  Her husband provides part of the information.  The patient reports that she has presented intermittent episodes of migrating abdominal pain, which is located sometimes in the upper abdomen diffusely but sometimes in the LLQ. This has been happening for the last 3 months.  Reported that she used to have pain in her abdomen in the past but does not recall exactly where.  Has no abdominal pain when sleeping. She denies taking any medication to decrease the pain, however her husband states that she only takes dicyclomine once a day before  she goes to sleep as she wants to only take the medication if strictly needed. She reports that she has also presented some episodes of watery bowel movements, up to 4 per day which are self limited, but she believes can happen up to 10 times per month. No melena or hematociehzua.Also has episodes of constipation, 5-6 days a month possibly.  She has been vomiting intermittently for the last few years, state she is at least vomiting 3 times per day, usually after lunch.  Reported she takes Zofran as needed, which helps decreasing these episodes.  Has not been eatign too often, possibly once a day during supper as her husband reports she is sleeping during the day multiple hours.  The patient denies having any fever, chills, hematochezia, melena, hematemesis, jaundice, pruritus or weight loss.  Last EGD: 10/20/2018  - Normal esophagus. - Z-line irregular, 39 cm from the incisors. - Gastritis. Biopsied. - Normal duodenal bulb and second portion of the duodenum.  1. Stomach, biopsy - ANTRAL AND OXYNTIC MUCOSA WITH SLIGHT CHRONIC INFLAMMATION AND HYPEREMIA. - WARTHIN-STARRY NEGATIVE FOR HELICOBACTER PYLORI. - NO INTESTINAL METAPLASIA, DYSPLASIA OR MALIGNANCY.  Last Colonoscopy: 10/20/2018 - Preparation of the colon was poor. - One small polyp at the hepatic flexure. - Two small polyps in the descending colon, removed with a cold snare. Resected and retrieved. - One 6 mm polyp at the recto-sigmoid colon, removed with a cold snare. Complete resection. Partial retrieval. - Diverticulosis in the sigmoid colon.  2. Colon, polyp(s), hepatic flexure, splenic flexure, descending - TUBULAR ADENOMA(S). - HYPERPLASTIC POLYP(S). - NO HIGH GRADE DYSPLASIA OR MALIGNANCY. 3. Rectosigmoid , polyp - HYPERPLASTIC POLYP. - NO ADENOMATOUS CHANGE  OR MALIGNANCY.  Past Medical History: Past Medical History:  Diagnosis Date   Anxiety    CVA (cerebral vascular accident) ([place])    right sided weakness, with  ling term memory loss.   Diabetes mellitus type II, controlled ([place])    Fibromyalgia    GERD (gastroesophageal reflux disease)    Hypercholesteremia    Hypertension    Hypothyroidism    Neuropathy     Past Surgical History: Past Surgical History:  Procedure Laterality Date   ABDOMINAL HYSTERECTOMY     APPENDECTOMY     BIOPSY  10/20/2018   Procedure: BIOPSY;  Surgeon: Rogene Houston, MD;  [facility];  Service: Endoscopy;;  gastric   CHOLECYSTECTOMY     COLONOSCOPY WITH PROPOFOL N/A 10/20/2018   Procedure: COLONOSCOPY WITH PROPOFOL;  Surgeon: Rogene Houston, MD;  [facility];  Service: Endoscopy;  Laterality: N/A;   ESOPHAGOGASTRODUODENOSCOPY (EGD) WITH PROPOFOL N/A 10/20/2018   Procedure: ESOPHAGOGASTRODUODENOSCOPY (EGD) WITH PROPOFOL;  Surgeon: Rogene Houston, MD;  [facility];  Service: Endoscopy;  Laterality: N/A;  1220   POLYPECTOMY  10/20/2018   Procedure: POLYPECTOMY;  Surgeon: Rogene Houston, MD;  [facility];  Service: Endoscopy;;  colon    TONSILLECTOMY     TUBAL LIGATION      Family History: Family History  Problem Relation Age of Onset   Colon cancer Mother    Colon cancer Maternal Grandfather    Colon cancer Other    Microcephaly Father     Social History: Social History   Tobacco Use  Smoking Status Former   Packs/day: 0.25   Years: 4.00   Pack years: 1.00   Types: Cigarettes   Quit date: 10/18/2010   Years since quitting: 9.9  Smokeless Tobacco Never   Social History   Substance and Sexual Activity  Alcohol Use Not Currently   Social History   Substance and Sexual Activity  Drug Use Never    Allergies: Allergies  Allergen Reactions   Baclofen Nausea And Vomiting    Medications: Current Outpatient Medications  Medication Sig Dispense Refill   aspirin 325 MG tablet Take 1 tablet (325 mg total) by mouth daily.     atorvastatin (LIPITOR) 40 MG tablet Take 40 mg by mouth daily.     b  complex vitamins capsule Take 1 capsule by mouth daily.     canagliflozin (INVOKANA) 100 MG TABS tablet Take 100 mg by mouth daily.     carvedilol (COREG) 3.125 MG tablet Take 3.125 mg by mouth 2 (two) times daily with a meal.     Cholecalciferol (VITAMIN D3) 1.25 MG (50000 UT) CAPS Take 1 capsule by mouth once a week.     clonazePAM (KLONOPIN) 1 MG tablet Take 1 mg by mouth 3 (three) times daily as needed for anxiety.     Continuous Blood Gluc Sensor (DEXCOM G6 SENSOR) MISC SMARTSIG:1 Each Topical Every 10 Days     Continuous Blood Gluc Transmit (DEXCOM G6 TRANSMITTER) MISC Inject into the skin daily.     dicyclomine (BENTYL) 10 MG capsule Take 1 capsule (10 mg total) by mouth 3 (three) times daily before meals. Due for yearly office visit. 270 capsule 0   fenofibrate (TRICOR) 145 MG tablet Take 145 mg by mouth daily.     folic acid (FOLVITE) 277 MCG tablet Take 400 mcg by mouth daily.     gabapentin (NEURONTIN) 300 MG capsule Take 300 mg by mouth 3 (three)  times daily.     hydrochlorothiazide (HYDRODIURIL) 25 MG tablet Take 25 mg by mouth daily.     Ibuprofen 200 MG CAPS Take 400 mg by mouth 2 (two) times daily as needed.      insulin aspart (NOVOLOG FLEXPEN) 100 UNIT/ML FlexPen Inject 1-20 Units into the skin 3 (three) times daily as needed. Patient uses a carb ratio 1:10 units per meal or 4-6 units with food add correction of 2 units per 50 above 200 pt uses up to 20 units daily     Insulin Glargine (LANTUS SOLOSTAR) 100 UNIT/ML Solostar Pen Inject 5 Units into the skin daily as needed.     levothyroxine (SYNTHROID) 175 MCG tablet Take 145 mcg by mouth daily before breakfast.      lisinopril (ZESTRIL) 10 MG tablet Take 10 mg by mouth daily.      metFORMIN (GLUCOPHAGE) 500 MG tablet Take 500 mg by mouth 2 (two) times daily with a meal.     pantoprazole (PROTONIX) 40 MG tablet Take 40 mg by mouth daily.     Vitamin D, Ergocalciferol, (DRISDOL) 1.25 MG (50000 UNIT) CAPS capsule Take 50,000 Units  by mouth. Two times per week.     clopidogrel (PLAVIX) 75 MG tablet Take 75 mg by mouth daily. (Patient not taking: Reported on 09/18/2020)     DULoxetine (CYMBALTA) 20 MG capsule Take 20 mg by mouth daily. (Patient not taking: Reported on 09/18/2020)     loratadine (CLARITIN) 10 MG tablet Take 10 mg by mouth daily. (Patient not taking: Reported on 09/18/2020)     meloxicam (MOBIC) 7.5 MG tablet Take 7.5 mg by mouth 2 (two) times daily. (Patient not taking: Reported on 09/18/2020)     Probiotic Product (ALIGN) 4 MG CAPS Take by mouth daily. (Patient not taking: Reported on 09/18/2020)     No current facility-administered medications for this visit.    Review of Systems: GENERAL: negative for malaise, night sweats HEENT: No changes in hearing or vision, no nose bleeds or other nasal problems. NECK: Negative for lumps, goiter, pain and significant neck swelling RESPIRATORY: Negative for cough, wheezing CARDIOVASCULAR: Negative for chest pain, leg swelling, palpitations, orthopnea GI: SEE HPI MUSCULOSKELETAL: Negative for joint pain or swelling, back pain, and muscle pain. SKIN: Negative for lesions, rash PSYCH: Negative for sleep disturbance, mood disorder and recent psychosocial stressors. HEMATOLOGY Negative for prolonged bleeding, bruising easily, and swollen nodes. ENDOCRINE: Negative for cold or heat intolerance, polyuria, polydipsia and goiter. NEURO: negative for tremor, gait imbalance, syncope and seizures. The remainder of the review of systems is noncontributory.   Physical Exam: BP (!) 93/50 (BP Location: Left Arm, Patient Position: Sitting, Cuff Size: Large)   Pulse (!) 108   Temp 97.9 F (36.6 C) (Oral)   Ht 5\' 4"  (1.626 m)   Wt 161 lb 9.6 oz (73.3 kg)   BMI 27.74 kg/m  GENERAL: The patient is AO x3, in no acute distress. HEENT: Head is normocephalic and atraumatic. EOMI are intact. Mouth is well hydrated and without lesions. NECK: Supple. No masses LUNGS: Clear to  auscultation. No presence of rhonchi/wheezing/rales. Adequate chest expansion HEART: RRR, normal s1 and s2. ABDOMEN: Soft, nontender, no guarding, no peritoneal signs, and nondistended. BS +. No masses. EXTREMITIES: Without any cyanosis, clubbing, rash, lesions or edema. NEUROLOGIC: AOx3, no focal motor deficit. SKIN: no jaundice, no rashes  Imaging/Labs: as above  I personally reviewed and interpreted the available labs, imaging and endoscopic files.  Impression and Plan: Amber Hawkins  is a 65 y.o. female with past medical history of CVA, diabetes, fibromyalgia, GERD, hypertension, hyperlipidemia, hypothyroidism, anxiety and IBS, who presents for follow up of abdominal pain, intermittent diarrhea and constipation, nausea.  The patient has presented chronic symptoms of multiple gastrointestinal complaints of unclear etiology.  She underwent previous EGD and colonoscopy for evaluation of the symptoms without a clear source explained her presentation.  I discussed with the patient and the husband proceeding with a CT of the abdomen and pelvis with IV contrast to rule out any intra-abdominal etiology causing her abdominal pain.  It is also possible that her symptoms are related to a functional etiology such as IBS but will need to evaluate organic etiologies given her age.  She would benefit from taking the Bentyl more often as she is not presenting any pain or complaints when sleeping, I encouraged him to take it more often if needed.  She can also benefit from continuing the Zofran to decrease the nausea episodes.  I will repeat a colonoscopy as she had a poor prep during her most recent procedure which is suboptimal given the presence of multiple polyps.    - Schedule CT abdomen/pelvis with IV contrast - Schedule colonoscopy - Take dicyclomine 10 mg at least two times a day for abdominal pain - Take Zofran as needed for nausea/vomiting  All questions were answered.      Harvel Quale,  MD Gastroenterology and Hepatology [facility]

## 2020-09-18 NOTE — H&P (View-Only) (Signed)
[name], M.D. Gastroenterology & Hepatology [facility] [address] [place], [place] [address]  Primary Care Physician: Arsenio Katz, NP [place] [address]  I will communicate my assessment and recommendations to the referring MD via EMR.  Problems: IBS M  History of Present Illness: Amber Hawkins is a 65 y.o. female with past medical history of CVA, diabetes, fibromyalgia, GERD, hypertension, hyperlipidemia, hypothyroidism, anxiety and IBS, who presents for follow up of abdominal pain, intermittent diarrhea and constipation, nausea.  The patient was last seen on 10/10/2018. At that time, the patient was scheduled to undergo an EGD and colonoscopy for evaluation of epigastric pain. The patient underwent this procedure on 10/20/2018, esophagogastroduodenospy showed erythema, erosions and erythema in the gastric body (pathology negative for H. pylori or any other alterations), normal duodenum.  Colonoscopy showed presence of poor prep of the colon.  There was a small polyp in the hepatic flexure, 2 small polyps in the descending colon and 1 6 mm polyp in the rectosigmoid colon (pathology was consistent with tubular adenomas x2 and the rest were hyperplastic polyps).  Diverticulosis.  She was prescribed dicyclomine 10 mg 3 times daily for management of possible IBS.  The patient is a poor historian.  Her husband provides part of the information.  The patient reports that she has presented intermittent episodes of migrating abdominal pain, which is located sometimes in the upper abdomen diffusely but sometimes in the LLQ. This has been happening for the last 3 months.  Reported that she used to have pain in her abdomen in the past but does not recall exactly where.  Has no abdominal pain when sleeping. She denies taking any medication to decrease the pain, however her husband states that she only takes dicyclomine once a day before  she goes to sleep as she wants to only take the medication if strictly needed. She reports that she has also presented some episodes of watery bowel movements, up to 4 per day which are self limited, but she believes can happen up to 10 times per month. No melena or hematociehzua.Also has episodes of constipation, 5-6 days a month possibly.  She has been vomiting intermittently for the last few years, state she is at least vomiting 3 times per day, usually after lunch.  Reported she takes Zofran as needed, which helps decreasing these episodes.  Has not been eatign too often, possibly once a day during supper as her husband reports she is sleeping during the day multiple hours.  The patient denies having any fever, chills, hematochezia, melena, hematemesis, jaundice, pruritus or weight loss.  Last EGD: 10/20/2018  - Normal esophagus. - Z-line irregular, 39 cm from the incisors. - Gastritis. Biopsied. - Normal duodenal bulb and second portion of the duodenum.  1. Stomach, biopsy - ANTRAL AND OXYNTIC MUCOSA WITH SLIGHT CHRONIC INFLAMMATION AND HYPEREMIA. - WARTHIN-STARRY NEGATIVE FOR HELICOBACTER PYLORI. - NO INTESTINAL METAPLASIA, DYSPLASIA OR MALIGNANCY.  Last Colonoscopy: 10/20/2018 - Preparation of the colon was poor. - One small polyp at the hepatic flexure. - Two small polyps in the descending colon, removed with a cold snare. Resected and retrieved. - One 6 mm polyp at the recto-sigmoid colon, removed with a cold snare. Complete resection. Partial retrieval. - Diverticulosis in the sigmoid colon.  2. Colon, polyp(s), hepatic flexure, splenic flexure, descending - TUBULAR ADENOMA(S). - HYPERPLASTIC POLYP(S). - NO HIGH GRADE DYSPLASIA OR MALIGNANCY. 3. Rectosigmoid , polyp - HYPERPLASTIC POLYP. - NO ADENOMATOUS CHANGE  OR MALIGNANCY.  Past Medical History: Past Medical History:  Diagnosis Date   Anxiety    CVA (cerebral vascular accident) ([place])    right sided weakness, with  ling term memory loss.   Diabetes mellitus type II, controlled ([place])    Fibromyalgia    GERD (gastroesophageal reflux disease)    Hypercholesteremia    Hypertension    Hypothyroidism    Neuropathy     Past Surgical History: Past Surgical History:  Procedure Laterality Date   ABDOMINAL HYSTERECTOMY     APPENDECTOMY     BIOPSY  10/20/2018   Procedure: BIOPSY;  Surgeon: Rogene Houston, MD;  [facility];  Service: Endoscopy;;  gastric   CHOLECYSTECTOMY     COLONOSCOPY WITH PROPOFOL N/A 10/20/2018   Procedure: COLONOSCOPY WITH PROPOFOL;  Surgeon: Rogene Houston, MD;  [facility];  Service: Endoscopy;  Laterality: N/A;   ESOPHAGOGASTRODUODENOSCOPY (EGD) WITH PROPOFOL N/A 10/20/2018   Procedure: ESOPHAGOGASTRODUODENOSCOPY (EGD) WITH PROPOFOL;  Surgeon: Rogene Houston, MD;  [facility];  Service: Endoscopy;  Laterality: N/A;  1220   POLYPECTOMY  10/20/2018   Procedure: POLYPECTOMY;  Surgeon: Rogene Houston, MD;  [facility];  Service: Endoscopy;;  colon    TONSILLECTOMY     TUBAL LIGATION      Family History: Family History  Problem Relation Age of Onset   Colon cancer Mother    Colon cancer Maternal Grandfather    Colon cancer Other    Microcephaly Father     Social History: Social History   Tobacco Use  Smoking Status Former   Packs/day: 0.25   Years: 4.00   Pack years: 1.00   Types: Cigarettes   Quit date: 10/18/2010   Years since quitting: 9.9  Smokeless Tobacco Never   Social History   Substance and Sexual Activity  Alcohol Use Not Currently   Social History   Substance and Sexual Activity  Drug Use Never    Allergies: Allergies  Allergen Reactions   Baclofen Nausea And Vomiting    Medications: Current Outpatient Medications  Medication Sig Dispense Refill   aspirin 325 MG tablet Take 1 tablet (325 mg total) by mouth daily.     atorvastatin (LIPITOR) 40 MG tablet Take 40 mg by mouth daily.     b  complex vitamins capsule Take 1 capsule by mouth daily.     canagliflozin (INVOKANA) 100 MG TABS tablet Take 100 mg by mouth daily.     carvedilol (COREG) 3.125 MG tablet Take 3.125 mg by mouth 2 (two) times daily with a meal.     Cholecalciferol (VITAMIN D3) 1.25 MG (50000 UT) CAPS Take 1 capsule by mouth once a week.     clonazePAM (KLONOPIN) 1 MG tablet Take 1 mg by mouth 3 (three) times daily as needed for anxiety.     Continuous Blood Gluc Sensor (DEXCOM G6 SENSOR) MISC SMARTSIG:1 Each Topical Every 10 Days     Continuous Blood Gluc Transmit (DEXCOM G6 TRANSMITTER) MISC Inject into the skin daily.     dicyclomine (BENTYL) 10 MG capsule Take 1 capsule (10 mg total) by mouth 3 (three) times daily before meals. Due for yearly office visit. 270 capsule 0   fenofibrate (TRICOR) 145 MG tablet Take 145 mg by mouth daily.     folic acid (FOLVITE) 419 MCG tablet Take 400 mcg by mouth daily.     gabapentin (NEURONTIN) 300 MG capsule Take 300 mg by mouth 3 (three)  times daily.     hydrochlorothiazide (HYDRODIURIL) 25 MG tablet Take 25 mg by mouth daily.     Ibuprofen 200 MG CAPS Take 400 mg by mouth 2 (two) times daily as needed.      insulin aspart (NOVOLOG FLEXPEN) 100 UNIT/ML FlexPen Inject 1-20 Units into the skin 3 (three) times daily as needed. Patient uses a carb ratio 1:10 units per meal or 4-6 units with food add correction of 2 units per 50 above 200 pt uses up to 20 units daily     Insulin Glargine (LANTUS SOLOSTAR) 100 UNIT/ML Solostar Pen Inject 5 Units into the skin daily as needed.     levothyroxine (SYNTHROID) 175 MCG tablet Take 145 mcg by mouth daily before breakfast.      lisinopril (ZESTRIL) 10 MG tablet Take 10 mg by mouth daily.      metFORMIN (GLUCOPHAGE) 500 MG tablet Take 500 mg by mouth 2 (two) times daily with a meal.     pantoprazole (PROTONIX) 40 MG tablet Take 40 mg by mouth daily.     Vitamin D, Ergocalciferol, (DRISDOL) 1.25 MG (50000 UNIT) CAPS capsule Take 50,000 Units  by mouth. Two times per week.     clopidogrel (PLAVIX) 75 MG tablet Take 75 mg by mouth daily. (Patient not taking: Reported on 09/18/2020)     DULoxetine (CYMBALTA) 20 MG capsule Take 20 mg by mouth daily. (Patient not taking: Reported on 09/18/2020)     loratadine (CLARITIN) 10 MG tablet Take 10 mg by mouth daily. (Patient not taking: Reported on 09/18/2020)     meloxicam (MOBIC) 7.5 MG tablet Take 7.5 mg by mouth 2 (two) times daily. (Patient not taking: Reported on 09/18/2020)     Probiotic Product (ALIGN) 4 MG CAPS Take by mouth daily. (Patient not taking: Reported on 09/18/2020)     No current facility-administered medications for this visit.    Review of Systems: GENERAL: negative for malaise, night sweats HEENT: No changes in hearing or vision, no nose bleeds or other nasal problems. NECK: Negative for lumps, goiter, pain and significant neck swelling RESPIRATORY: Negative for cough, wheezing CARDIOVASCULAR: Negative for chest pain, leg swelling, palpitations, orthopnea GI: SEE HPI MUSCULOSKELETAL: Negative for joint pain or swelling, back pain, and muscle pain. SKIN: Negative for lesions, rash PSYCH: Negative for sleep disturbance, mood disorder and recent psychosocial stressors. HEMATOLOGY Negative for prolonged bleeding, bruising easily, and swollen nodes. ENDOCRINE: Negative for cold or heat intolerance, polyuria, polydipsia and goiter. NEURO: negative for tremor, gait imbalance, syncope and seizures. The remainder of the review of systems is noncontributory.   Physical Exam: BP (!) 93/50 (BP Location: Left Arm, Patient Position: Sitting, Cuff Size: Large)   Pulse (!) 108   Temp 97.9 F (36.6 C) (Oral)   Ht 5\' 4"  (1.626 m)   Wt 161 lb 9.6 oz (73.3 kg)   BMI 27.74 kg/m  GENERAL: The patient is AO x3, in no acute distress. HEENT: Head is normocephalic and atraumatic. EOMI are intact. Mouth is well hydrated and without lesions. NECK: Supple. No masses LUNGS: Clear to  auscultation. No presence of rhonchi/wheezing/rales. Adequate chest expansion HEART: RRR, normal s1 and s2. ABDOMEN: Soft, nontender, no guarding, no peritoneal signs, and nondistended. BS +. No masses. EXTREMITIES: Without any cyanosis, clubbing, rash, lesions or edema. NEUROLOGIC: AOx3, no focal motor deficit. SKIN: no jaundice, no rashes  Imaging/Labs: as above  I personally reviewed and interpreted the available labs, imaging and endoscopic files.  Impression and Plan: Amber Hawkins  is a 65 y.o. female with past medical history of CVA, diabetes, fibromyalgia, GERD, hypertension, hyperlipidemia, hypothyroidism, anxiety and IBS, who presents for follow up of abdominal pain, intermittent diarrhea and constipation, nausea.  The patient has presented chronic symptoms of multiple gastrointestinal complaints of unclear etiology.  She underwent previous EGD and colonoscopy for evaluation of the symptoms without a clear source explained her presentation.  I discussed with the patient and the husband proceeding with a CT of the abdomen and pelvis with IV contrast to rule out any intra-abdominal etiology causing her abdominal pain.  It is also possible that her symptoms are related to a functional etiology such as IBS but will need to evaluate organic etiologies given her age.  She would benefit from taking the Bentyl more often as she is not presenting any pain or complaints when sleeping, I encouraged him to take it more often if needed.  She can also benefit from continuing the Zofran to decrease the nausea episodes.  I will repeat a colonoscopy as she had a poor prep during her most recent procedure which is suboptimal given the presence of multiple polyps.    - Schedule CT abdomen/pelvis with IV contrast - Schedule colonoscopy - Take dicyclomine 10 mg at least two times a day for abdominal pain - Take Zofran as needed for nausea/vomiting  All questions were answered.      Harvel Quale,  MD Gastroenterology and Hepatology [facility]

## 2020-09-18 NOTE — Patient Instructions (Signed)
Schedule CT abdomen/pelvis with IV contrast Schedule colonoscopy Take dicyclomine at least two times a day for abdominal pain Take zofran as needed for nausea/vomiting

## 2020-09-19 ENCOUNTER — Other Ambulatory Visit (INDEPENDENT_AMBULATORY_CARE_PROVIDER_SITE_OTHER): Payer: Self-pay

## 2020-09-22 ENCOUNTER — Encounter (INDEPENDENT_AMBULATORY_CARE_PROVIDER_SITE_OTHER): Payer: Self-pay

## 2020-09-28 ENCOUNTER — Other Ambulatory Visit (INDEPENDENT_AMBULATORY_CARE_PROVIDER_SITE_OTHER): Payer: Self-pay | Admitting: Gastroenterology

## 2020-10-01 ENCOUNTER — Other Ambulatory Visit (HOSPITAL_COMMUNITY)
Admission: RE | Admit: 2020-10-01 | Discharge: 2020-10-01 | Disposition: A | Discharge status: Home / Self Care | Payer: 59 | Source: Ambulatory Visit | Attending: Gastroenterology | Admitting: Gastroenterology

## 2020-10-01 DIAGNOSIS — I639 Cerebral infarction, unspecified: Secondary | ICD-10-CM | POA: Diagnosis not present

## 2020-10-01 DIAGNOSIS — R109 Unspecified abdominal pain: Secondary | ICD-10-CM | POA: Insufficient documentation

## 2020-10-01 LAB — BASIC METABOLIC PANEL
Anion gap: 8 (ref 5–15)
BUN: 19 mg/dL (ref 8–23)
CO2: 25 mmol/L (ref 22–32)
Calcium: 11.6 mg/dL — ABNORMAL HIGH (ref 8.9–10.3)
Chloride: 105 mmol/L (ref 98–111)
Creatinine, Ser: 0.86 mg/dL (ref 0.44–1.00)
GFR, Estimated: >60 mL/min (ref >60)
Glucose, Bld: 151 mg/dL — ABNORMAL HIGH (ref 70–99)
Potassium: 4.7 mmol/L (ref 3.5–5.1)
Sodium: 138 mmol/L (ref 135–145)

## 2020-10-02 NOTE — OR Nursing (Signed)
Dr. Briant Cedar notified of patient's calcium 11.6. No orders received, ok to proceed with procedure.

## 2020-10-03 ENCOUNTER — Ambulatory Visit (HOSPITAL_COMMUNITY): Payer: 59 | Admitting: Anesthesiology

## 2020-10-03 ENCOUNTER — Encounter (HOSPITAL_COMMUNITY)
Admission: RE | Disposition: A | Discharge status: Home / Self Care | Payer: Self-pay | Source: Home / Self Care | Attending: Gastroenterology

## 2020-10-03 ENCOUNTER — Encounter (HOSPITAL_COMMUNITY): Payer: Self-pay | Admitting: Gastroenterology

## 2020-10-03 ENCOUNTER — Other Ambulatory Visit: Payer: Self-pay

## 2020-10-03 ENCOUNTER — Ambulatory Visit (HOSPITAL_COMMUNITY)
Admission: RE | Admit: 2020-10-03 | Discharge: 2020-10-03 | Disposition: A | Discharge status: Home / Self Care | Payer: 59 | Attending: Gastroenterology | Admitting: Gastroenterology

## 2020-10-03 DIAGNOSIS — Z800 Family history of malignant neoplasm of digestive organs: Secondary | ICD-10-CM | POA: Insufficient documentation

## 2020-10-03 DIAGNOSIS — R197 Diarrhea, unspecified: Secondary | ICD-10-CM | POA: Insufficient documentation

## 2020-10-03 DIAGNOSIS — Z9049 Acquired absence of other specified parts of digestive tract: Secondary | ICD-10-CM | POA: Diagnosis not present

## 2020-10-03 DIAGNOSIS — Z87891 Personal history of nicotine dependence: Secondary | ICD-10-CM | POA: Insufficient documentation

## 2020-10-03 DIAGNOSIS — E039 Hypothyroidism, unspecified: Secondary | ICD-10-CM | POA: Insufficient documentation

## 2020-10-03 DIAGNOSIS — E7800 Pure hypercholesterolemia, unspecified: Secondary | ICD-10-CM | POA: Insufficient documentation

## 2020-10-03 DIAGNOSIS — Z79899 Other long term (current) drug therapy: Secondary | ICD-10-CM | POA: Diagnosis not present

## 2020-10-03 DIAGNOSIS — D124 Benign neoplasm of descending colon: Secondary | ICD-10-CM | POA: Insufficient documentation

## 2020-10-03 DIAGNOSIS — K589 Irritable bowel syndrome without diarrhea: Secondary | ICD-10-CM | POA: Diagnosis not present

## 2020-10-03 DIAGNOSIS — K5730 Diverticulosis of large intestine without perforation or abscess without bleeding: Secondary | ICD-10-CM | POA: Insufficient documentation

## 2020-10-03 DIAGNOSIS — D122 Benign neoplasm of ascending colon: Secondary | ICD-10-CM | POA: Diagnosis not present

## 2020-10-03 DIAGNOSIS — Z90710 Acquired absence of both cervix and uterus: Secondary | ICD-10-CM | POA: Diagnosis not present

## 2020-10-03 DIAGNOSIS — I69311 Memory deficit following cerebral infarction: Secondary | ICD-10-CM | POA: Insufficient documentation

## 2020-10-03 DIAGNOSIS — Z7984 Long term (current) use of oral hypoglycemic drugs: Secondary | ICD-10-CM | POA: Insufficient documentation

## 2020-10-03 DIAGNOSIS — Z794 Long term (current) use of insulin: Secondary | ICD-10-CM | POA: Insufficient documentation

## 2020-10-03 DIAGNOSIS — Z791 Long term (current) use of non-steroidal anti-inflammatories (NSAID): Secondary | ICD-10-CM | POA: Insufficient documentation

## 2020-10-03 DIAGNOSIS — Z881 Allergy status to other antibiotic agents status: Secondary | ICD-10-CM | POA: Diagnosis not present

## 2020-10-03 DIAGNOSIS — E1140 Type 2 diabetes mellitus with diabetic neuropathy, unspecified: Secondary | ICD-10-CM | POA: Diagnosis not present

## 2020-10-03 DIAGNOSIS — Z7982 Long term (current) use of aspirin: Secondary | ICD-10-CM | POA: Insufficient documentation

## 2020-10-03 DIAGNOSIS — I10 Essential (primary) hypertension: Secondary | ICD-10-CM | POA: Insufficient documentation

## 2020-10-03 DIAGNOSIS — R1030 Lower abdominal pain, unspecified: Secondary | ICD-10-CM | POA: Diagnosis present

## 2020-10-03 DIAGNOSIS — I69351 Hemiplegia and hemiparesis following cerebral infarction affecting right dominant side: Secondary | ICD-10-CM | POA: Diagnosis not present

## 2020-10-03 HISTORY — PX: BIOPSY: SHX5522

## 2020-10-03 HISTORY — PX: POLYPECTOMY: SHX149

## 2020-10-03 HISTORY — PX: COLONOSCOPY WITH PROPOFOL: SHX5780

## 2020-10-03 LAB — HM COLONOSCOPY

## 2020-10-03 LAB — GLUCOSE, CAPILLARY: Glucose-Capillary: 158 mg/dL — ABNORMAL HIGH (ref 70–99)

## 2020-10-03 SURGERY — COLONOSCOPY WITH PROPOFOL
Anesthesia: General

## 2020-10-03 MED ORDER — PROPOFOL 10 MG/ML IV BOLUS
INTRAVENOUS | Status: DC | PRN
Start: 2020-10-03 — End: 2020-10-03
  Administered 2020-10-03 (×5): 50 mg via INTRAVENOUS
  Administered 2020-10-03: 100 mg via INTRAVENOUS

## 2020-10-03 MED ORDER — LACTATED RINGERS IV SOLN
INTRAVENOUS | Status: DC
  Administered 2020-10-03: 1000 mL via INTRAVENOUS

## 2020-10-03 MED ORDER — STERILE WATER FOR IRRIGATION IR SOLN
Status: DC | PRN
  Administered 2020-10-03: 200 mL

## 2020-10-03 NOTE — Anesthesia Postprocedure Evaluation (Signed)
Anesthesia Post Note  Patient: Amber Hawkins  Procedure(s) Performed: COLONOSCOPY WITH PROPOFOL POLYPECTOMY INTESTINAL BIOPSY  Patient location during evaluation: Phase II Anesthesia Type: General Level of consciousness: awake Pain management: pain level controlled Vital Signs Assessment: post-procedure vital signs reviewed and stable Respiratory status: spontaneous breathing and respiratory function stable Cardiovascular status: blood pressure returned to baseline and stable Postop Assessment: no headache and no apparent nausea or vomiting Anesthetic complications: no Comments: Late entry   No notable events documented.   Last Vitals:  Vitals:   10/03/20 0957 10/03/20 1140  BP: 111/83 105/68  Pulse: 89 89  Resp: 13 20  Temp: 36.4 C 36.7 C  SpO2: 95% 96%    Last Pain:  Vitals:   10/03/20 1140  TempSrc: Oral  PainSc: 0-No pain                 Louann Sjogren

## 2020-10-03 NOTE — Transfer of Care (Signed)
Immediate Anesthesia Transfer of Care Note  Patient: Amber Hawkins  Procedure(s) Performed: COLONOSCOPY WITH PROPOFOL POLYPECTOMY INTESTINAL BIOPSY  Patient Location: Endoscopy Unit  Anesthesia Type:General  Level of Consciousness: awake, alert  and oriented  Airway & Oxygen Therapy: Patient Spontanous Breathing  Post-op Assessment: Report given to RN and Post -op Vital signs reviewed and stable  Post vital signs: Reviewed and stable  Last Vitals:  Vitals Value Taken Time  BP    Temp    Pulse 71 10/03/20  1140  Resp    SpO2 93 10/03/20  1140    Last Pain:  Vitals:   10/03/20 1102  TempSrc:   PainSc: 7       Patients Stated Pain Goal: 9 ([phone] [id number])  Complications: No notable events documented.

## 2020-10-03 NOTE — Op Note (Signed)
[facility], [facility] Patient Name: Amber Hawkins Procedure Date: 10/03/2020 10:45 AM MRN: [id number] Date of Birth: 1955-03-18 Attending MD: [name] ,  CSN: [id number] Age: 65 Admit Type: Outpatient Procedure:                Colonoscopy Indications:              Lower abdominal pain, Clinically significant                            diarrhea of unexplained origin Providers:                [name], [place] Page, [place] Risa Grill, Technician Referring MD:              Medicines:                Monitored Anesthesia Care Complications:            No immediate complications. Estimated Blood Loss:     Estimated blood loss: none. Procedure:                Pre-Anesthesia Assessment:                           - Prior to the procedure, a History and Physical                            was performed, and patient medications, allergies                            and sensitivities were reviewed. The patient's                            tolerance of previous anesthesia was reviewed.                           - The risks and benefits of the procedure and the                            sedation options and risks were discussed with the                            patient. All questions were answered and informed                            consent was obtained.                           - ASA Grade Assessment: III - A patient with severe                            systemic disease.                           After obtaining informed consent, the colonoscope  was passed under direct vision. Throughout the                            procedure, the patient's blood pressure, pulse, and                            oxygen saturations were monitored continuously. The                            [id number] scope was introduced through                            the anus and advanced to the the cecum, identified                             by appendiceal orifice and ileocecal valve. The                            colonoscopy was performed without difficulty. The                            patient tolerated the procedure well. The quality                            of the bowel preparation was adequate. Scope In: 11:06:31 AM Scope Out: [id number] AM Scope Withdrawal Time: 0 hours 20 minutes 17 seconds  Total Procedure Duration: 0 hours 30 minutes 10 seconds  Findings:      The perianal and digital rectal examinations were normal.      Three sessile polyps were found in the descending colon and ascending       colon. The polyps were 3 to 5 mm in size. These polyps were removed with       a cold snare. Resection and retrieval were complete.      A few small-mouthed diverticula were found in the sigmoid colon,       descending colon and ascending colon.      The retroflexed view of the distal rectum and anal verge was normal and       showed no anal or rectal abnormalities. Impression:               - Three 3 to 5 mm polyps in the descending colon                            and in the ascending colon, removed with a cold                            snare. Resected and retrieved.                           - Diverticulosis in the sigmoid colon, in the                            descending colon and in the ascending colon.                           -  The distal rectum and anal verge are normal on                            retroflexion view. Moderate Sedation:      Per Anesthesia Care Recommendation:           - Discharge patient to home (ambulatory).                           - Resume previous diet.                           - Await pathology results.                           - Repeat colonoscopy for surveillance based on                            pathology results. Procedure Code(s):        --- Professional ---                           [id number], Colonoscopy, flexible; with removal of                            tumor(s),  polyp(s), or other lesion(s) by snare                            technique Diagnosis Code(s):        --- Professional ---                           K63.5, Polyp of colon                           R10.30, Lower abdominal pain, unspecified                           R19.7, Diarrhea, unspecified                           K57.30, Diverticulosis of large intestine without                            perforation or abscess without bleeding CPT copyright 2019 American Medical Association. All rights reserved. The codes documented in this report are preliminary and upon coder review may  be revised to meet current compliance requirements. [name], MD [name],  10/03/2020 11:45:00 AM This report has been signed electronically. Number of Addenda: 0

## 2020-10-03 NOTE — Interval H&P Note (Signed)
History and Physical Interval Note:  10/03/2020 11:00 AM Amber Hawkins is a 65 y.o. female with past medical history of CVA, diabetes, fibromyalgia, GERD, hypertension, hyperlipidemia, hypothyroidism, anxiety and IBS, who presents for follow up of abdominal pain, intermittent diarrhea and history of colon polyps.  Patient repor5ts she still has abdominal pain in her lower abdomen. States she feels her diarrhea is slightly better compared to prior.  Denies any nausea, vomiting, fever, chills, melena or hematochezia.  BP 111/83   Pulse 89   Temp 97.6 F (36.4 C) (Oral)   Resp 13   Ht '5\' 4"'$  (1.626 m)   Wt 73 kg   SpO2 95%   BMI 27.64 kg/m  GENERAL: The patient is AO x3, in no acute distress. HEENT: Head is normocephalic and atraumatic. EOMI are intact. Mouth is well hydrated and without lesions. NECK: Supple. No masses LUNGS: Clear to auscultation. No presence of rhonchi/wheezing/rales. Adequate chest expansion HEART: RRR, normal s1 and s2. ABDOMEN: Soft, nontender, no guarding, no peritoneal signs, and nondistended. BS +. No masses. EXTREMITIES: Without any cyanosis, clubbing, rash, lesions or edema. NEUROLOGIC: AOx3, no focal motor deficit. SKIN: no jaundice, no rashes  Amber Hawkins  has presented today for surgery, with the diagnosis of Hx of colonic polyps.  The various methods of treatment have been discussed with the patient and family. After consideration of risks, benefits and other options for treatment, the patient has consented to  Procedure(s) with comments: COLONOSCOPY WITH PROPOFOL (N/A) - 10:55 as a surgical intervention.  The patient's history has been reviewed, patient examined, no change in status, stable for surgery.  I have reviewed the patient's chart and labs.  Questions were answered to the patient's satisfaction.     Amber Hawkins

## 2020-10-03 NOTE — Discharge Instructions (Addendum)
You are being discharged to home.  Resume your previous diet.  We are waiting for your pathology results.  Your physician has recommended a repeat colonoscopy for surveillance based on pathology results.  

## 2020-10-03 NOTE — Anesthesia Preprocedure Evaluation (Signed)
Anesthesia Evaluation  Patient identified by MRN, date of birth, ID band Patient awake    Reviewed: Allergy & Precautions, H&P , NPO status , Patient's Chart, lab work & pertinent test results, reviewed documented beta blocker date and time   Airway Mallampati: II  TM Distance: >3 FB Neck ROM: full    Dental no notable dental hx.    Pulmonary neg pulmonary ROS, Current Smoker,    Pulmonary exam normal breath sounds clear to auscultation       Cardiovascular Exercise Tolerance: Good hypertension, negative cardio ROS   Rhythm:regular Rate:Normal     Neuro/Psych PSYCHIATRIC DISORDERS Anxiety Depression  Neuromuscular disease CVA, Residual Symptoms    GI/Hepatic Neg liver ROS, GERD  Medicated,  Endo/Other  diabetesHypothyroidism   Renal/GU negative Renal ROS  negative genitourinary   Musculoskeletal   Abdominal   Peds  Hematology negative hematology ROS (+)   Anesthesia Other Findings   Reproductive/Obstetrics negative OB ROS                             Anesthesia Physical Anesthesia Plan  ASA: 3  Anesthesia Plan: General   Post-op Pain Management:    Induction:   PONV Risk Score and Plan: Propofol infusion  Airway Management Planned:   Additional Equipment:   Intra-op Plan:   Post-operative Plan:   Informed Consent: I have reviewed the patients History and Physical, chart, labs and discussed the procedure including the risks, benefits and alternatives for the proposed anesthesia with the patient or authorized representative who has indicated his/her understanding and acceptance.     Dental Advisory Given  Plan Discussed with: CRNA  Anesthesia Plan Comments:         Anesthesia Quick Evaluation

## 2020-10-06 LAB — SURGICAL PATHOLOGY

## 2020-10-07 ENCOUNTER — Encounter (INDEPENDENT_AMBULATORY_CARE_PROVIDER_SITE_OTHER): Payer: Self-pay | Admitting: *Deleted

## 2020-10-07 ENCOUNTER — Encounter (HOSPITAL_COMMUNITY): Payer: Self-pay | Admitting: Gastroenterology

## 2020-10-10 ENCOUNTER — Other Ambulatory Visit: Payer: Self-pay

## 2020-10-10 ENCOUNTER — Ambulatory Visit (HOSPITAL_COMMUNITY)
Admission: RE | Admit: 2020-10-10 | Discharge: 2020-10-10 | Disposition: A | Discharge status: Home / Self Care | Payer: 59 | Source: Ambulatory Visit | Attending: Gastroenterology | Admitting: Gastroenterology

## 2020-10-10 DIAGNOSIS — R109 Unspecified abdominal pain: Secondary | ICD-10-CM | POA: Insufficient documentation

## 2020-10-10 MED ORDER — IOHEXOL 350 MG/ML SOLN
85.0000 mL | Freq: Once | INTRAVENOUS | Status: AC | PRN
  Administered 2020-10-10: 85 mL via INTRAVENOUS

## 2020-10-17 ENCOUNTER — Other Ambulatory Visit (HOSPITAL_COMMUNITY): Payer: Self-pay

## 2020-10-17 DIAGNOSIS — D72829 Elevated white blood cell count, unspecified: Secondary | ICD-10-CM

## 2020-10-20 ENCOUNTER — Other Ambulatory Visit: Payer: Self-pay

## 2020-10-20 ENCOUNTER — Inpatient Hospital Stay (HOSPITAL_COMMUNITY): Payer: 59 | Attending: Hematology

## 2020-10-20 ENCOUNTER — Other Ambulatory Visit (HOSPITAL_COMMUNITY): Payer: 59

## 2020-10-20 DIAGNOSIS — F17210 Nicotine dependence, cigarettes, uncomplicated: Secondary | ICD-10-CM | POA: Insufficient documentation

## 2020-10-20 DIAGNOSIS — D72829 Elevated white blood cell count, unspecified: Secondary | ICD-10-CM | POA: Diagnosis not present

## 2020-10-20 LAB — CBC WITH DIFFERENTIAL/PLATELET
Abs Immature Granulocytes: 0.04 10*3/uL (ref 0.00–0.07)
Basophils Absolute: 0.1 10*3/uL (ref 0.0–0.1)
Basophils Relative: 1 %
Eosinophils Absolute: 0.3 10*3/uL (ref 0.0–0.5)
Eosinophils Relative: 3 %
HCT: 42.4 % (ref 36.0–46.0)
Hemoglobin: 13.9 g/dL (ref 12.0–15.0)
Immature Granulocytes: 0 %
Lymphocytes Relative: 34 %
Lymphs Abs: 3.7 10*3/uL (ref 0.7–4.0)
MCH: 30.7 pg (ref 26.0–34.0)
MCHC: 32.8 g/dL (ref 30.0–36.0)
MCV: 93.6 fL (ref 80.0–100.0)
Monocytes Absolute: 0.8 10*3/uL (ref 0.1–1.0)
Monocytes Relative: 8 %
Neutro Abs: 6 10*3/uL (ref 1.7–7.7)
Neutrophils Relative %: 54 %
Platelets: 335 10*3/uL (ref 150–400)
RBC: 4.53 MIL/uL (ref 3.87–5.11)
RDW: 13.3 % (ref 11.5–15.5)
WBC: 10.9 10*3/uL — ABNORMAL HIGH (ref 4.0–10.5)
nRBC: 0 % (ref 0.0–0.2)

## 2020-10-20 LAB — LACTATE DEHYDROGENASE: LDH: 106 U/L (ref 98–192)

## 2020-10-21 ENCOUNTER — Other Ambulatory Visit (HOSPITAL_COMMUNITY): Payer: 59

## 2020-10-28 ENCOUNTER — Ambulatory Visit (HOSPITAL_COMMUNITY): Payer: 59 | Admitting: Hematology

## 2020-11-02 NOTE — Progress Notes (Signed)
[place],  [address]   CLINIC:  Medical Oncology/Hematology  PCP:  Glenda Chroman, MD [address] / [place] [address]  [phone]  REASON FOR VISIT:  Follow-up for leukocytosis  PRIOR THERAPY: none  CURRENT THERAPY: not done  INTERVAL HISTORY:  [name], a 65 y.o. female, returns for routine follow-up for her leukocytosis. Amber Hawkins was last seen on 05/14/19.  Today she reports feeling well. She denies fevers, night sweats, unexpected weight loss, rash, or recent hospitalizations. She reports that is still currently smoking 1 ppd. She reports occasional cough but denies hemoptysis.   REVIEW OF SYSTEMS:  Review of Systems  Constitutional:  Positive for fatigue (depleted). Negative for appetite change, fever and unexpected weight change.  Respiratory:  Positive for cough (occasional) and shortness of breath (with exertion). Negative for hemoptysis.   Gastrointestinal:  Positive for nausea.  Genitourinary:  Positive for bladder incontinence and dysuria.   Skin:  Negative for rash.  Neurological:  Positive for dizziness, headaches and numbness (hands and feet).  All other systems reviewed and are negative.  PAST MEDICAL/SURGICAL HISTORY:  Past Medical History:  Diagnosis Date   Anxiety    CVA (cerebral vascular accident) ([place])    right sided weakness, with ling term memory loss.   Diabetes mellitus type II, controlled ([place])    Fibromyalgia    GERD (gastroesophageal reflux disease)    Hypercholesteremia    Hypertension    Hypothyroidism    Neuropathy    Past Surgical History:  Procedure Laterality Date   ABDOMINAL HYSTERECTOMY     APPENDECTOMY     BIOPSY  10/20/2018   Procedure: BIOPSY;  Surgeon: Rogene Houston, MD;  Location: [facility];  Service: Endoscopy;;  gastric   BIOPSY  10/03/2020   Procedure: BIOPSY;  Surgeon: Harvel Quale, MD;  Location: [facility];  Service: Gastroenterology;;    CHOLECYSTECTOMY     COLONOSCOPY WITH PROPOFOL N/A 10/20/2018   Procedure: COLONOSCOPY WITH PROPOFOL;  Surgeon: Rogene Houston, MD;  [facility]: [facility];  Service: Endoscopy;  Laterality: N/A;   COLONOSCOPY WITH PROPOFOL N/A 10/03/2020   Procedure: COLONOSCOPY WITH PROPOFOL;  Surgeon: Harvel Quale, MD;  [facility];  Service: Gastroenterology;  Laterality: N/A;  10:55   ESOPHAGOGASTRODUODENOSCOPY (EGD) WITH PROPOFOL N/A 10/20/2018   Procedure: ESOPHAGOGASTRODUODENOSCOPY (EGD) WITH PROPOFOL;  Surgeon: Rogene Houston, MD;  [facility];  Service: Endoscopy;  Laterality: N/A;  1220   POLYPECTOMY  10/20/2018   Procedure: POLYPECTOMY;  Surgeon: Rogene Houston, MD;  [facility];  Service: Endoscopy;;  colon    POLYPECTOMY  10/03/2020   Procedure: POLYPECTOMY INTESTINAL;  Surgeon: Harvel Quale, MD;  [facility]: [facility];  Service: Gastroenterology;;   TONSILLECTOMY     TUBAL LIGATION      SOCIAL HISTORY:  Social History   Socioeconomic History   Marital status: Married    Spouse name: Jeneen Rinks    Number of children: 1   Years of education: Not on file   Highest education level: Not on file  Occupational History   Occupation: unemployed  Tobacco Use   Smoking status: Some Days    Packs/day: 0.25    Years: 4.00    Pack years: 1.00    Types: Cigarettes    Last attempt to quit: 10/18/2010    Years since quitting: 10.0   Smokeless tobacco: Never  Vaping Use  Vaping Use: Never used  Substance and Sexual Activity   Alcohol use: Not Currently   Drug use: Never   Sexual activity: Not Currently    Birth control/protection: Surgical  Other Topics Concern   Not on file  Social History Narrative   Not on file   Social Determinants of Health   Financial Resource Strain: Not on file  Food Insecurity: Not on file  Transportation Needs: Not on file  Physical Activity: Not on file  Stress: Not on file  Social Connections:  Not on file  Intimate Partner Violence: Not on file    FAMILY HISTORY:  Family History  Problem Relation Age of Onset   Colon cancer Mother    Colon cancer Maternal Grandfather    Colon cancer Other    Microcephaly Father     CURRENT MEDICATIONS:  Current Outpatient Medications  Medication Sig Dispense Refill   aspirin 325 MG tablet Take 1 tablet (325 mg total) by mouth daily.     atorvastatin (LIPITOR) 40 MG tablet Take 40 mg by mouth daily.     b complex vitamins capsule Take 1 capsule by mouth daily.     canagliflozin (INVOKANA) 100 MG TABS tablet Take 100 mg by mouth daily.     carvedilol (COREG) 3.125 MG tablet Take 3.125 mg by mouth 2 (two) times daily with a meal.     Cholecalciferol (VITAMIN D3) 1.25 MG (50000 UT) CAPS Take 1 capsule by mouth once a week.     clonazePAM (KLONOPIN) 1 MG tablet Take 1 mg by mouth 3 (three) times daily as needed for anxiety.     Continuous Blood Gluc Sensor (DEXCOM G6 SENSOR) MISC SMARTSIG:1 Each Topical Every 10 Days     Continuous Blood Gluc Transmit (DEXCOM G6 TRANSMITTER) MISC Inject into the skin daily.     dicyclomine (BENTYL) 10 MG capsule TAKE 1 CAPSULE BY MOUTH 3 TIMES DAILY BEFORE MEALS. DUE FOR YEARLY OFFICE VISIT 270 capsule 0   DULoxetine (CYMBALTA) 20 MG capsule Take 20 mg by mouth daily. (Patient not taking: Reported on 09/18/2020)     fenofibrate (TRICOR) 145 MG tablet Take 145 mg by mouth daily.     folic acid (FOLVITE) Q000111Q MCG tablet Take 400 mcg by mouth daily.     gabapentin (NEURONTIN) 300 MG capsule Take 300 mg by mouth 3 (three) times daily.     hydrochlorothiazide (HYDRODIURIL) 25 MG tablet Take 25 mg by mouth daily.     Ibuprofen 200 MG CAPS Take 400 mg by mouth 2 (two) times daily as needed.      insulin aspart (NOVOLOG FLEXPEN) 100 UNIT/ML FlexPen Inject 1-20 Units into the skin 3 (three) times daily as needed. Patient uses a carb ratio 1:10 units per meal or 4-6 units with food add correction of 2 units per 50 above 200  pt uses up to 20 units daily     Insulin Glargine (LANTUS SOLOSTAR) 100 UNIT/ML Solostar Pen Inject 5 Units into the skin daily as needed.     levothyroxine (SYNTHROID) 175 MCG tablet Take 145 mcg by mouth daily before breakfast.      lisinopril (ZESTRIL) 10 MG tablet Take 10 mg by mouth daily.      loratadine (CLARITIN) 10 MG tablet Take 10 mg by mouth daily. (Patient not taking: No sig reported)     meloxicam (MOBIC) 7.5 MG tablet Take 7.5 mg by mouth 2 (two) times daily. (Patient not taking: Reported on 09/18/2020)     metFORMIN (  GLUCOPHAGE) 500 MG tablet Take 500 mg by mouth 2 (two) times daily with a meal.     pantoprazole (PROTONIX) 40 MG tablet Take 40 mg by mouth daily.     Probiotic Product (ALIGN) 4 MG CAPS Take by mouth daily.     Vitamin D, Ergocalciferol, (DRISDOL) 1.25 MG (50000 UNIT) CAPS capsule Take 50,000 Units by mouth. Two times per week.     No current facility-administered medications for this visit.    ALLERGIES:  Allergies  Allergen Reactions   Baclofen Nausea And Vomiting    PHYSICAL EXAM:  Performance status (ECOG): 1 - Symptomatic but completely ambulatory  There were no vitals filed for this visit. Wt Readings from Last 3 Encounters:  10/03/20 161 lb (73 kg)  09/18/20 161 lb 9.6 oz (73.3 kg)  09/24/19 164 lb 4.8 oz (74.5 kg)   Physical Exam Vitals reviewed.  Constitutional:      Appearance: Normal appearance.  Cardiovascular:     Rate and Rhythm: Normal rate and regular rhythm.     Pulses: Normal pulses.     Heart sounds: Normal heart sounds.  Pulmonary:     Effort: Pulmonary effort is normal.     Breath sounds: Normal breath sounds.  Neurological:     General: No focal deficit present.     Mental Status: She is alert and oriented to person, place, and time.  Psychiatric:        Mood and Affect: Mood normal.        Behavior: Behavior normal.    LABORATORY DATA:  I have reviewed the labs as listed.  CBC Latest Ref Rng & Units 10/20/2020  04/18/2020 09/17/2019  WBC 4.0 - 10.5 K/uL 10.9(H) 10.7(H) 13.1(H)  Hemoglobin 12.0 - 15.0 g/dL 13.9 14.3 15.0  Hematocrit 36.0 - 46.0 % 42.4 44.0 47.3(H)  Platelets 150 - 400 K/uL 335 370 396   CMP Latest Ref Rng & Units 10/01/2020 04/25/2019 10/18/2018  Glucose 70 - 99 mg/dL 151(H) 119(H) 129(H)  BUN 8 - 23 mg/dL '19 10 9  '$ Creatinine 0.44 - 1.00 mg/dL 0.86 0.79 0.59  Sodium 135 - 145 mmol/L 138 140 142  Potassium 3.5 - 5.1 mmol/L 4.7 4.7 3.9  Chloride 98 - 111 mmol/L 105 106 108  CO2 22 - 32 mmol/L '25 24 22  '$ Calcium 8.9 - 10.3 mg/dL 11.6(H) 10.1 9.6  Total Protein 6.5 - 8.1 g/dL - 7.3 -  Total Bilirubin 0.3 - 1.2 mg/dL - 0.3 -  Alkaline Phos 38 - 126 U/L - 63 -  AST 15 - 41 U/L - 23 -  ALT 0 - 44 U/L - 21 -      Component Value Date/Time   RBC 4.53 10/20/2020 1509   MCV 93.6 10/20/2020 1509   MCH 30.7 10/20/2020 1509   MCHC 32.8 10/20/2020 1509   RDW 13.3 10/20/2020 1509   LYMPHSABS 3.7 10/20/2020 1509   MONOABS 0.8 10/20/2020 1509   EOSABS 0.3 10/20/2020 1509   BASOSABS 0.1 10/20/2020 1509    DIAGNOSTIC IMAGING:  I have independently reviewed the scans and discussed with the patient. CT Abdomen Pelvis W Contrast  Result Date: 10/12/2020 CLINICAL DATA:  Abdominal pain. EXAM: CT ABDOMEN AND PELVIS WITH CONTRAST TECHNIQUE: Multidetector CT imaging of the abdomen and pelvis was performed using the standard protocol following bolus administration of intravenous contrast. CONTRAST:  62m OMNIPAQUE IOHEXOL 350 MG/ML SOLN COMPARISON:  Chest CT dated 05/08/2019. CT of the abdomen pelvis dated 11/03/2015. FINDINGS: Lower chest: Bibasilar  atelectasis. There is coronary vascular calcification and calcification of the mitral annulus. No intra-abdominal free air or free fluid. Hepatobiliary: The liver is unremarkable. No intrahepatic biliary ductal dilatation. Cholecystectomy. No retained calcified stone noted in the central CBD. Pancreas: Unremarkable. No pancreatic ductal dilatation or  surrounding inflammatory changes. Spleen: Normal in size without focal abnormality. Adrenals/Urinary Tract: The adrenal glands unremarkable. There is a 9 mm nonobstructing left renal inferior pole calculus. There is no hydronephrosis on either side. There is symmetric enhancement and excretion of contrast by both kidneys. The visualized ureters and the urinary bladder appear unremarkable. Stomach/Bowel: There is sigmoid diverticulosis without active inflammatory changes. There is no bowel obstruction or active inflammation. Appendectomy. Vascular/Lymphatic: Advanced aortoiliac atherosclerotic disease. The IVC is unremarkable. There is a retroaortic left renal vein anatomy. No portal venous gas. There is no adenopathy. Reproductive: Hysterectomy. No adnexal masses. Other: None Musculoskeletal: Degenerative changes of the spine. No acute osseous pathology. IMPRESSION: 1. No acute intra-abdominal or pelvic pathology. 2. Sigmoid diverticulosis. 3. A 9 mm nonobstructing left renal inferior pole calculus. No hydronephrosis. 4. Aortic Atherosclerosis (ICD10-I70.0). Electronically Signed   By: Anner Crete M.D.   On: 10/12/2020 18:20     ASSESSMENT:  1.  JAK2 V645F/BCR ABL negative leukocytosis: -She was worked up for leukocytosis with CBC on 04/02/2019 showing white count of 15.6 with normal differential. -She is current active smoker. -She denies any chronic systemic steroid use.  Denies any splenectomy.  She was treated with antibiotics for her elevated white count several times over the last few years. -We reviewed results of blood work from 04/25/2019.  White count is 14.9 with hemoglobin of 15.1 and hematocrit of 47.7.  Platelet count is 398.  LDH and CRP were normal.  Differential showed elevated absolute neutrophil count and lymphocytes.  ANA and rheumatoid factor was negative. -BCR/ABL was negative by FISH.  JAK2 V6 45F and reflex testing was negative. -Flow cytometry was negative for any  lymphoproliferative disorders. - Most likely etiology is smoking related leukocytosis.   2.  Tobacco abuse: - She smokes 1 pack/day. - CT scan on 05/08/2019 was lung RADS 2.   PLAN:  1.  JAK2 V645F/BCR ABL negative leukocytosis: - She denies any recurrent infections.  No systemic steroids. - Reviewed CBC from 10/20/2020.  White count improved to 10.9 with normal differential.  LDH was normal. - Recommend follow-up in 6 months with repeat labs.   2.  Tobacco abuse: - She is continuing to smoke at this time. - Last CT lung cancer screening protocol was in March 2021. - Recommend follow-up with yearly CT scans for lung cancer screening.  She would like to have it done at next visit in 6 months.   Orders placed this encounter:  No orders of the defined types were placed in this encounter.    Derek Jack, MD [place] [phone]   I, Thana Ates, am acting as a scribe for Dr. Derek Jack.  I, Derek Jack MD, have reviewed the above documentation for accuracy and completeness, and I agree with the above.

## 2020-11-03 ENCOUNTER — Other Ambulatory Visit: Payer: Self-pay

## 2020-11-03 ENCOUNTER — Inpatient Hospital Stay (HOSPITAL_BASED_OUTPATIENT_CLINIC_OR_DEPARTMENT_OTHER): Payer: 59 | Admitting: Hematology

## 2020-11-03 ENCOUNTER — Encounter (HOSPITAL_COMMUNITY): Payer: Self-pay | Admitting: Hematology

## 2020-11-03 VITALS — BP 90/62 | HR 107 | Temp 96.7°F | Resp 17 | Wt 156.0 lb

## 2020-11-03 DIAGNOSIS — Z122 Encounter for screening for malignant neoplasm of respiratory organs: Secondary | ICD-10-CM | POA: Diagnosis not present

## 2020-11-03 DIAGNOSIS — D72829 Elevated white blood cell count, unspecified: Secondary | ICD-10-CM

## 2020-11-03 DIAGNOSIS — Z87891 Personal history of nicotine dependence: Secondary | ICD-10-CM | POA: Diagnosis not present

## 2020-11-03 NOTE — Patient Instructions (Addendum)
[facility] Discharge Instructions  You were seen today by Dr. Katragadda. He went over your recent results and scans. You will be scheduled for a CT scan of your chest prior to your next visit. Dr. Katragadda will see you back in 6 months for labs and follow up.   Thank you for choosing [facility] at [facility] to provide your oncology and hematology care.  To afford each patient quality time with our provider, please arrive at least 15 minutes before your scheduled appointment time.   If you have a lab appointment with the Cancer Center please come in thru the Main Entrance and check in at the main information desk  You need to re-schedule your appointment should you arrive 10 or more minutes late.  We strive to give you quality time with our providers, and arriving late affects you and other patients whose appointments are after yours.  Also, if you no show three or more times for appointments you may be dismissed from the clinic at the providers discretion.     Again, thank you for choosing [facility].  Our hope is that these requests will decrease the amount of time that you wait before being seen by our physicians.       _____________________________________________________________  Should you have questions after your visit to [facility], please contact our office at [phone] between the hours of 8:00 a.m. and 4:30 p.m.  Voicemails left after 4:00 p.m. will not be returned until the following business day.  For prescription refill requests, have your pharmacy contact our office and allow 72 hours.    Cancer Center Support Programs:   > Cancer Support Group  2nd Tuesday of the month 1pm-2pm, Journey Room   

## 2021-01-15 ENCOUNTER — Other Ambulatory Visit (INDEPENDENT_AMBULATORY_CARE_PROVIDER_SITE_OTHER): Payer: Self-pay | Admitting: Gastroenterology

## 2021-01-15 NOTE — Telephone Encounter (Signed)
09/18/1920 last seen Dr.Castaneda.

## 2021-01-19 ENCOUNTER — Encounter (INDEPENDENT_AMBULATORY_CARE_PROVIDER_SITE_OTHER): Payer: Self-pay | Admitting: Gastroenterology

## 2021-01-19 ENCOUNTER — Other Ambulatory Visit: Payer: Self-pay

## 2021-01-19 ENCOUNTER — Ambulatory Visit (INDEPENDENT_AMBULATORY_CARE_PROVIDER_SITE_OTHER): Payer: 59 | Admitting: Gastroenterology

## 2021-01-19 VITALS — BP 123/69 | HR 107 | Temp 98.2°F | Ht 64.0 in | Wt 156.4 lb

## 2021-01-19 DIAGNOSIS — K582 Mixed irritable bowel syndrome: Secondary | ICD-10-CM

## 2021-01-19 DIAGNOSIS — R112 Nausea with vomiting, unspecified: Secondary | ICD-10-CM | POA: Diagnosis not present

## 2021-01-19 NOTE — Patient Instructions (Signed)
Continue taking MiraLAX as needed for episodes of constipation Continue dicyclomine every 12 hours for abdominal pain If abdominal pain recurs or having worsening bloating, start IBgard 1 capsule every 8 hours Explained presumed etiology of IBS symptoms. Patient was counseled about the benefit of implementing a low FODMAP to improve symptoms and recurrent episodes. A dietary list was provided to the patient.

## 2021-01-19 NOTE — Progress Notes (Signed)
[name], M.D. Gastroenterology & Hepatology [facility] [address] [place],  [address]  Primary Care Physician: Glenda Chroman, MD [place] [id number]  I will communicate my assessment and recommendations to the referring MD via EMR.  Problems: IBS-M  History of Present Illness: Amber Hawkins is a 65 y.o. female with past medical history of CVA, diabetes, fibromyalgia, GERD, hypertension, hyperlipidemia, hypothyroidism, anxiety and IBS, who presents for follow up of IBS M.  The patient was last seen on 09/18/2020. At that time, the patient was scheduled to undergo a colonoscopy with findings described below.  She was advised to take Bentyl 10 mg 2 times a day and Zofran as needed for nausea and vomiting.  Underwent a CT of the abdomen and pelvis with IV contrast on 10/10/2020 which showed presence of diverticulosis and a 9 mm left inferior pole calculus.  She has been taking Miralax as needed for episodes of constipation. She believes it has helped as she does not get constipated often. She also states that she has presented some episodes of diarrhea, which happen possibly twice a week, which she thinks possibly is related due to the type of food she eats. She drinks coffee often, also thinks that this may be leading to episodes of diarrhea. She usually does not eat during the day but eats supper and wakes up in the middle of the night to eat.  Her husband states that her eating habits " all over the place as she eats a lot during the night".  Has lost 5 lb since the last time she was in the office. Has been eating less recently as she is not eating snacks as much as in the past.  She has some episodes of abdominal pain intermittently in her LUQ, which may happen with changes in her position. She is taking Bentyl 10 mg twice a day, but does not know if this has improved her abdominal pain. She gets bloated frequently.  Also feels nauseated occasionally.  States that the symptoms are similar to the ones she has had for multiple years and have not significantly changed recently.  The patient denies having any fever, chills, hematochezia, melena, hematemesis, diarrhea, jaundice, pruritus or weight loss.  Last EGD: 10/20/2018  - Normal esophagus. - Z-line irregular, 39 cm from the incisors. - Gastritis. Biopsied. - Normal duodenal bulb and second portion of the duodenum.   1. Stomach, biopsy - ANTRAL AND OXYNTIC MUCOSA WITH SLIGHT CHRONIC INFLAMMATION AND HYPEREMIA. - WARTHIN-STARRY NEGATIVE FOR HELICOBACTER PYLORI. - NO INTESTINAL METAPLASIA, DYSPLASIA OR MALIGNANCY.   Last Colonoscopy: 10/03/2020 There was presence of three 3 to 5 mm polyps in the descending and ascending colon removed with a cold snare.  Pathology showed presence of masses and serrated polyp, tubular adenoma and hyperplastic polyp.  Diverticulosis in the descending and ascending colon.  Random colonic biopsies were negative for microscopic colitis.  Recommend to have repeat colonoscopy in 5 years.  Past Medical History: Past Medical History:  Diagnosis Date   Anxiety    CVA (cerebral vascular accident) ([place])    right sided weakness, with ling term memory loss.   Diabetes mellitus type II, controlled ([place])    Fibromyalgia    GERD (gastroesophageal reflux disease)    Hypercholesteremia    Hypertension    Hypothyroidism    Neuropathy     Past Surgical History: Past Surgical History:  Procedure Laterality Date   ABDOMINAL HYSTERECTOMY  APPENDECTOMY     BIOPSY  10/20/2018   Procedure: BIOPSY;  Surgeon: Rogene Houston, MD;  [facility];  Service: Endoscopy;;  gastric   BIOPSY  10/03/2020   Procedure: BIOPSY;  Surgeon: Harvel Quale, MD;  [facility];  Service: Gastroenterology;;   CHOLECYSTECTOMY     COLONOSCOPY WITH PROPOFOL N/A 10/20/2018   Procedure: COLONOSCOPY WITH PROPOFOL;  Surgeon:  Rogene Houston, MD;  [facility];  Service: Endoscopy;  Laterality: N/A;   COLONOSCOPY WITH PROPOFOL N/A 10/03/2020   Procedure: COLONOSCOPY WITH PROPOFOL;  Surgeon: Harvel Quale, MD;  [facility];  Service: Gastroenterology;  Laterality: N/A;  10:55   ESOPHAGOGASTRODUODENOSCOPY (EGD) WITH PROPOFOL N/A 10/20/2018   Procedure: ESOPHAGOGASTRODUODENOSCOPY (EGD) WITH PROPOFOL;  Surgeon: Rogene Houston, MD;  [facility];  Service: Endoscopy;  Laterality: N/A;  1220   POLYPECTOMY  10/20/2018   Procedure: POLYPECTOMY;  Surgeon: Rogene Houston, MD;  [facility];  Service: Endoscopy;;  colon    POLYPECTOMY  10/03/2020   Procedure: POLYPECTOMY INTESTINAL;  Surgeon: Harvel Quale, MD;  [facility];  Service: Gastroenterology;;   TONSILLECTOMY     TUBAL LIGATION      Family History: Family History  Problem Relation Age of Onset   Colon cancer Mother    Colon cancer Maternal Grandfather    Colon cancer Other    Microcephaly Father     Social History: Social History   Tobacco Use  Smoking Status Every Day   Packs/day: 0.25   Years: 4.00   Pack years: 1.00   Types: Cigarettes   Last attempt to quit: 10/18/2010   Years since quitting: 10.2  Smokeless Tobacco Never   Social History   Substance and Sexual Activity  Alcohol Use Not Currently   Social History   Substance and Sexual Activity  Drug Use Never    Allergies: Allergies  Allergen Reactions   Baclofen Nausea And Vomiting    Medications: Current Outpatient Medications  Medication Sig Dispense Refill   aspirin 325 MG tablet Take 1 tablet (325 mg total) by mouth daily.     atorvastatin (LIPITOR) 40 MG tablet Take 40 mg by mouth daily.     b complex vitamins capsule Take 1 capsule by mouth daily.     BD PEN NEEDLE NANO 2ND GEN 32G X 4 MM MISC USE TWICE DAILY AS DIRECTED 90     Black Cohosh 540 MG CAPS Take 1 capsule by mouth daily.      canagliflozin (INVOKANA) 100 MG TABS tablet Take 100 mg by mouth daily.     carvedilol (COREG) 3.125 MG tablet Take 3.125 mg by mouth 2 (two) times daily with a meal.     clonazePAM (KLONOPIN) 1 MG tablet Take 1 mg by mouth 3 (three) times daily as needed for anxiety.     clopidogrel (PLAVIX) 75 MG tablet Take 75 mg by mouth daily.     clotrimazole-betamethasone (LOTRISONE) cream Apply topically as needed.     Continuous Blood Gluc Receiver (DEXCOM G6 RECEIVER) DEVI Inject into the skin daily.     Continuous Blood Gluc Sensor (DEXCOM G6 SENSOR) MISC SMARTSIG:1 Each Topical Every 10 Days     Continuous Blood Gluc Transmit (DEXCOM G6 TRANSMITTER) MISC Inject into the skin daily.     dicyclomine (BENTYL) 10 MG capsule TAKE 1 CAPSULE BY MOUTH 3 TIMES DAILY BEFORE MEALS. DUE FOR YEARLY OFFICE VISIT 270 capsule  3   DULoxetine (CYMBALTA) 20 MG capsule Take 20 mg by mouth daily.     ergocalciferol (VITAMIN D2) 1.25 MG (50000 UT) capsule Take 50,000 Units by mouth. Takes one tablet twice weekly     fenofibrate (TRICOR) 145 MG tablet Take 145 mg by mouth daily.     folic acid (FOLVITE) 086 MCG tablet Take 400 mcg by mouth daily.     gabapentin (NEURONTIN) 300 MG capsule Take 300 mg by mouth 3 (three) times daily.     glucose blood (ACCU-CHEK GUIDE) test strip USE TO CHECK SUGAR 4 TIMES DAILY     Ibuprofen 200 MG CAPS Take 400 mg by mouth 2 (two) times daily as needed.      insulin aspart (NOVOLOG FLEXPEN) 100 UNIT/ML FlexPen Inject 1-20 Units into the skin 3 (three) times daily as needed. Patient uses a carb ratio 1:10 units per meal or 4-6 units with food add correction of 2 units per 50 above 200 pt uses up to 20 units daily     Insulin Glargine (LANTUS SOLOSTAR) 100 UNIT/ML Solostar Pen Inject 5 Units into the skin daily as needed.     lansoprazole (PREVACID) 30 MG capsule Take 30 mg by mouth daily.     levothyroxine (SYNTHROID) 125 MCG tablet Take 125 mcg by mouth every morning.     lisinopril  (ZESTRIL) 10 MG tablet Take 10 mg by mouth daily.      metFORMIN (GLUCOPHAGE) 500 MG tablet Take 500 mg by mouth 2 (two) times daily with a meal.     Omega-3 Fatty Acids (FISH OIL) 1000 MG CAPS Take 1 capsule by mouth daily.     Probiotic Product (ALIGN) 4 MG CAPS Take by mouth daily.     SUMAtriptan (IMITREX) 100 MG tablet Take 1 tablet by mouth as needed.     venlafaxine XR (EFFEXOR-XR) 150 MG 24 hr capsule Take 150 mg by mouth daily.     Vitamin D, Ergocalciferol, (DRISDOL) 1.25 MG (50000 UNIT) CAPS capsule Take 50,000 Units by mouth. Two times per week.     hydrochlorothiazide (HYDRODIURIL) 25 MG tablet Take 25 mg by mouth daily.     No current facility-administered medications for this visit.    Review of Systems: GENERAL: negative for malaise, night sweats HEENT: No changes in hearing or vision, no nose bleeds or other nasal problems. NECK: Negative for lumps, goiter, pain and significant neck swelling RESPIRATORY: Negative for cough, wheezing CARDIOVASCULAR: Negative for chest pain, leg swelling, palpitations, orthopnea GI: SEE HPI MUSCULOSKELETAL: Negative for joint pain or swelling, back pain, and muscle pain. SKIN: Negative for lesions, rash PSYCH: Negative for sleep disturbance, mood disorder and recent psychosocial stressors. HEMATOLOGY Negative for prolonged bleeding, bruising easily, and swollen nodes. ENDOCRINE: Negative for cold or heat intolerance, polyuria, polydipsia and goiter. NEURO: negative for tremor, gait imbalance, syncope and seizures. The remainder of the review of systems is noncontributory.   Physical Exam: BP 123/69 (BP Location: Left Arm, Patient Position: Sitting, Cuff Size: Large)   Pulse (!) 107   Temp 98.2 F (36.8 C) (Oral)   Ht 5\' 4"  (1.626 m)   Wt 156 lb 6.4 oz (70.9 kg)   BMI 26.85 kg/m  GENERAL: The patient is AO x3, in no acute distress. HEENT: Head is normocephalic and atraumatic. EOMI are intact. Mouth is well hydrated and without  lesions. NECK: Supple. No masses LUNGS: Clear to auscultation. No presence of rhonchi/wheezing/rales. Adequate chest expansion HEART: RRR, normal s1 and s2. ABDOMEN:  Soft, nontender, no guarding, no peritoneal signs, and nondistended. BS +. No masses. EXTREMITIES: Without any cyanosis, clubbing, rash, lesions or edema. NEUROLOGIC: AOx3, no focal motor deficit. SKIN: no jaundice, no rashes  Imaging/Labs: as above  I personally reviewed and interpreted the available labs, imaging and endoscopic files.  Impression and Plan: Amber Hawkins is a 65 y.o. female with past medical history of CVA, diabetes, fibromyalgia, GERD, hypertension, hyperlipidemia, hypothyroidism, anxiety and IBS, who presents for follow up of IBS M.  The patient has presented mild improvement of her symptoms compared to prior.  Even though she is still symptomatic, she has had the symptoms for multiple years and has not presented any red flag signs.  She has not presented any abnormalities at the endoscopic level and had normal cross-sectional abdominal imaging.  I believe her symptoms are related to her IBS, for which she should continue taking her current regimen of dicyclomine 10 mg twice a day, as well as MiraLAX as needed for constipation.  She may have some improvement with the intake of IBgard as needed but also from the implementation of low FODMAP diet as this will decrease the amount of gas production.  She understood and agreed.  - Continue taking MiraLAX as needed for episodes of constipation - Continue dicyclomine 10 mg every 12 hours for abdominal pain - If abdominal pain recurs or having worsening bloating, start IBgard 1 capsule every 8 hours - Explained presumed etiology of IBS symptoms. Patient was counseled about the benefit of implementing a low FODMAP to improve symptoms and recurrent episodes. A dietary list was provided to the patient.   All questions were answered.      Harvel Quale,  MD Gastroenterology and Hepatology [facility]

## 2021-02-16 ENCOUNTER — Other Ambulatory Visit: Payer: Self-pay | Admitting: Internal Medicine

## 2021-02-16 ENCOUNTER — Ambulatory Visit
Admission: RE | Admit: 2021-02-16 | Discharge: 2021-02-16 | Disposition: A | Discharge status: Home / Self Care | Payer: 59 | Source: Ambulatory Visit | Attending: Internal Medicine | Admitting: Internal Medicine

## 2021-02-16 DIAGNOSIS — Z1231 Encounter for screening mammogram for malignant neoplasm of breast: Secondary | ICD-10-CM

## 2021-05-04 ENCOUNTER — Other Ambulatory Visit: Payer: Self-pay

## 2021-05-04 ENCOUNTER — Inpatient Hospital Stay (HOSPITAL_COMMUNITY): Payer: 59 | Attending: Hematology

## 2021-05-04 ENCOUNTER — Ambulatory Visit (HOSPITAL_COMMUNITY)
Admission: RE | Admit: 2021-05-04 | Discharge: 2021-05-04 | Disposition: A | Discharge status: Home / Self Care | Payer: 59 | Source: Ambulatory Visit | Attending: Hematology | Admitting: Hematology

## 2021-05-04 DIAGNOSIS — Z122 Encounter for screening for malignant neoplasm of respiratory organs: Secondary | ICD-10-CM | POA: Insufficient documentation

## 2021-05-04 DIAGNOSIS — D72829 Elevated white blood cell count, unspecified: Secondary | ICD-10-CM | POA: Diagnosis present

## 2021-05-04 DIAGNOSIS — Z87891 Personal history of nicotine dependence: Secondary | ICD-10-CM | POA: Insufficient documentation

## 2021-05-04 LAB — CBC WITH DIFFERENTIAL/PLATELET
Abs Immature Granulocytes: 0.05 10*3/uL (ref 0.00–0.07)
Basophils Absolute: 0.1 10*3/uL (ref 0.0–0.1)
Basophils Relative: 1 %
Eosinophils Absolute: 0.3 10*3/uL (ref 0.0–0.5)
Eosinophils Relative: 2 %
HCT: 42.2 % (ref 36.0–46.0)
Hemoglobin: 13.8 g/dL (ref 12.0–15.0)
Immature Granulocytes: 0 %
Lymphocytes Relative: 31 %
Lymphs Abs: 3.9 10*3/uL (ref 0.7–4.0)
MCH: 29.8 pg (ref 26.0–34.0)
MCHC: 32.7 g/dL (ref 30.0–36.0)
MCV: 91.1 fL (ref 80.0–100.0)
Monocytes Absolute: 0.9 10*3/uL (ref 0.1–1.0)
Monocytes Relative: 7 %
Neutro Abs: 7.4 10*3/uL (ref 1.7–7.7)
Neutrophils Relative %: 59 %
Platelets: 368 10*3/uL (ref 150–400)
RBC: 4.63 MIL/uL (ref 3.87–5.11)
RDW: 12.7 % (ref 11.5–15.5)
WBC: 12.6 10*3/uL — ABNORMAL HIGH (ref 4.0–10.5)
nRBC: 0 % (ref 0.0–0.2)

## 2021-05-04 LAB — COMPREHENSIVE METABOLIC PANEL
ALT: 11 U/L (ref 0–44)
AST: 17 U/L (ref 15–41)
Albumin: 4.1 g/dL (ref 3.5–5.0)
Alkaline Phosphatase: 68 U/L (ref 38–126)
Anion gap: 10 (ref 5–15)
BUN: 12 mg/dL (ref 8–23)
CO2: 22 mmol/L (ref 22–32)
Calcium: 10.1 mg/dL (ref 8.9–10.3)
Chloride: 106 mmol/L (ref 98–111)
Creatinine, Ser: 0.76 mg/dL (ref 0.44–1.00)
GFR, Estimated: >60 mL/min (ref >60)
Glucose, Bld: 137 mg/dL — ABNORMAL HIGH (ref 70–99)
Potassium: 4.3 mmol/L (ref 3.5–5.1)
Sodium: 138 mmol/L (ref 135–145)
Total Bilirubin: 0.6 mg/dL (ref 0.3–1.2)
Total Protein: 7.3 g/dL (ref 6.5–8.1)

## 2021-05-04 LAB — LACTATE DEHYDROGENASE: LDH: 114 U/L (ref 98–192)

## 2021-05-11 ENCOUNTER — Ambulatory Visit (HOSPITAL_COMMUNITY): Payer: 59 | Admitting: Hematology

## 2021-05-19 ENCOUNTER — Other Ambulatory Visit (HOSPITAL_COMMUNITY): Payer: Self-pay | Admitting: Endocrinology

## 2021-05-19 DIAGNOSIS — R0989 Other specified symptoms and signs involving the circulatory and respiratory systems: Secondary | ICD-10-CM

## 2021-05-21 ENCOUNTER — Telehealth: Payer: Self-pay | Admitting: *Deleted

## 2021-05-21 NOTE — Telephone Encounter (Signed)
Attempted to reach out to patient to reschedule previously scheduled appointment that she had cancelled due to sickness.  Message left for her to call and reschedule appointments. ?

## 2021-05-25 ENCOUNTER — Encounter (HOSPITAL_COMMUNITY): Payer: Self-pay

## 2021-05-25 ENCOUNTER — Inpatient Hospital Stay (HOSPITAL_COMMUNITY): Payer: 59 | Attending: Hematology | Admitting: Hematology

## 2021-05-25 ENCOUNTER — Other Ambulatory Visit: Payer: Self-pay

## 2021-05-25 VITALS — BP 104/71 | HR 92 | Temp 97.2°F | Resp 20 | Wt 156.4 lb

## 2021-05-25 DIAGNOSIS — D72829 Elevated white blood cell count, unspecified: Secondary | ICD-10-CM | POA: Diagnosis present

## 2021-05-25 DIAGNOSIS — Z993 Dependence on wheelchair: Secondary | ICD-10-CM | POA: Insufficient documentation

## 2021-05-25 DIAGNOSIS — F17210 Nicotine dependence, cigarettes, uncomplicated: Secondary | ICD-10-CM | POA: Insufficient documentation

## 2021-05-25 NOTE — Progress Notes (Signed)
? ?[facility] ?[address] ?[place], [place] [address] ? ? ?CLINIC:  ?Medical Oncology/Hematology ? ?PCP:  ?Amber Chroman, MD ?[place] / [place] [address]  ?[phone] ? ?REASON FOR VISIT:  ?Follow-up for leukocytosis ? ?PRIOR THERAPY: none ? ?CURRENT THERAPY: surveillance ? ?INTERVAL HISTORY:  ?[name], a 66 y.o. female, returns for routine follow-up for her leukocytosis. Amber Hawkins was last seen on 11/03/2020. ? ?Today she reports feeling good. She denies fevers, night sweats, and weight loss. She continues to smoke 1 ppd.  ? ?REVIEW OF SYSTEMS:  ?Review of Systems  ?Constitutional:  Positive for fatigue. Negative for appetite change, fever and unexpected weight change.  ?Gastrointestinal:  Positive for abdominal pain (6/10), nausea and vomiting.  ?Endocrine: Negative for hot flashes.  ?Genitourinary:  Positive for difficulty urinating.   ?Musculoskeletal:  Positive for arthralgias (6/10 legs).  ?Neurological:  Positive for headaches and numbness.  ?Psychiatric/Behavioral:  Positive for sleep disturbance.   ?All other systems reviewed and are negative. ? ?PAST MEDICAL/SURGICAL HISTORY:  ?Past Medical History:  ?Diagnosis Date  ? Anxiety   ? CVA (cerebral vascular accident) [facility])   ? right sided weakness, with ling term memory loss.  ? Diabetes mellitus type II, controlled ([place])   ? Fibromyalgia   ? GERD (gastroesophageal reflux disease)   ? Hypercholesteremia   ? Hypertension   ? Hypothyroidism   ? Neuropathy   ? ?Past Surgical History:  ?Procedure Laterality Date  ? ABDOMINAL HYSTERECTOMY    ? APPENDECTOMY    ? BIOPSY  10/20/2018  ? Procedure: BIOPSY;  Surgeon: Rogene Houston, MD;  [facility];  Service: Endoscopy;;  gastric  ? BIOPSY  10/03/2020  ? Procedure: BIOPSY;  Surgeon: Harvel Quale, MD;  [facility];  Service: Gastroenterology;;  ? CHOLECYSTECTOMY    ? COLONOSCOPY WITH PROPOFOL N/A 10/20/2018  ? Procedure: COLONOSCOPY WITH PROPOFOL;  Surgeon:  Rogene Houston, MD;  [facility];  Service: Endoscopy;  Laterality: N/A;  ? COLONOSCOPY WITH PROPOFOL N/A 10/03/2020  ? Procedure: COLONOSCOPY WITH PROPOFOL;  Surgeon: Harvel Quale, MD;  [facility];  Service: Gastroenterology;  Laterality: N/A;  10:55  ? ESOPHAGOGASTRODUODENOSCOPY (EGD) WITH PROPOFOL N/A 10/20/2018  ? Procedure: ESOPHAGOGASTRODUODENOSCOPY (EGD) WITH PROPOFOL;  Surgeon: Rogene Houston, MD;  [facility];  Service: Endoscopy;  Laterality: N/A;  1220  ? POLYPECTOMY  10/20/2018  ? Procedure: POLYPECTOMY;  Surgeon: Rogene Houston, MD;  [facility];  Service: Endoscopy;;  colon ?  ? POLYPECTOMY  10/03/2020  ? Procedure: POLYPECTOMY INTESTINAL;  Surgeon: Harvel Quale, MD;  [facility];  Service: Gastroenterology;;  ? TONSILLECTOMY    ? TUBAL LIGATION    ? ? ?SOCIAL HISTORY:  ?Social History  ? ?Socioeconomic History  ? Marital status: Married  ?  Spouse name: Jeneen Rinks   ? Number of children: 1  ? Years of education: Not on file  ? Highest education level: Not on file  ?Occupational History  ? Occupation: unemployed  ?Tobacco Use  ? Smoking status: Every Day  ?  Packs/day: 0.25  ?  Years: 4.00  ?  Pack years: 1.00  ?  Types: Cigarettes  ?  Last attempt to quit: 10/18/2010  ?  Years since quitting: 10.6  ? Smokeless tobacco: Never  ?Vaping Use  ? Vaping Use: Never used  ?Substance and Sexual Activity  ? Alcohol use: Not Currently  ?  Drug use: Never  ? Sexual activity: Not Currently  ?  Birth control/protection: Surgical  ?Other Topics Concern  ? Not on file  ?Social History Narrative  ? Not on file  ? ?Social Determinants of Health  ? ?Financial Resource Strain: Not on file  ?Food Insecurity: Not on file  ?Transportation Needs: Not on file  ?Physical Activity: Not on file  ?Stress: Not on file  ?Social Connections: Not on file  ?Intimate Partner Violence: Not on file  ? ? ?FAMILY HISTORY:  ?Family History  ?Problem Relation  Age of Onset  ? Colon cancer Mother   ? Microcephaly Father   ? Breast cancer Maternal Aunt   ? Colon cancer Maternal Grandfather   ? Colon cancer Other   ? ? ?CURRENT MEDICATIONS:  ?Current Outpatient Medications  ?Medication Sig Dispense Refill  ? aspirin 325 MG tablet Take 1 tablet (325 mg total) by mouth daily.    ? atorvastatin (LIPITOR) 40 MG tablet Take 40 mg by mouth daily.    ? b complex vitamins capsule Take 1 capsule by mouth daily.    ? BD PEN NEEDLE NANO 2ND GEN 32G X 4 MM MISC USE TWICE DAILY AS DIRECTED 90    ? Black Cohosh 540 MG CAPS Take 1 capsule by mouth daily.    ? canagliflozin (INVOKANA) 100 MG TABS tablet Take 100 mg by mouth daily.    ? carvedilol (COREG) 3.125 MG tablet Take 3.125 mg by mouth 2 (two) times daily with a meal.    ? clonazePAM (KLONOPIN) 1 MG tablet Take 1 mg by mouth 3 (three) times daily as needed for anxiety.    ? clopidogrel (PLAVIX) 75 MG tablet Take 75 mg by mouth daily.    ? clotrimazole-betamethasone (LOTRISONE) cream Apply topically as needed.    ? Continuous Blood Gluc Receiver (DEXCOM G6 RECEIVER) DEVI Inject into the skin daily.    ? Continuous Blood Gluc Sensor (DEXCOM G6 SENSOR) MISC SMARTSIG:1 Each Topical Every 10 Days    ? Continuous Blood Gluc Transmit (DEXCOM G6 TRANSMITTER) MISC Inject into the skin daily.    ? dicyclomine (BENTYL) 10 MG capsule TAKE 1 CAPSULE BY MOUTH 3 TIMES DAILY BEFORE MEALS. DUE FOR YEARLY OFFICE VISIT 270 capsule 3  ? DULoxetine (CYMBALTA) 20 MG capsule Take 20 mg by mouth daily.    ? ergocalciferol (VITAMIN D2) 1.25 MG (50000 UT) capsule Take 50,000 Units by mouth. Takes one tablet twice weekly    ? fenofibrate (TRICOR) 145 MG tablet Take 145 mg by mouth daily.    ? folic acid (FOLVITE) 263 MCG tablet Take 400 mcg by mouth daily.    ? gabapentin (NEURONTIN) 300 MG capsule Take 300 mg by mouth 3 (three) times daily.    ? glucose blood (ACCU-CHEK GUIDE) test strip USE TO CHECK SUGAR 4 TIMES DAILY    ? hydrochlorothiazide (HYDRODIURIL)  25 MG tablet Take 25 mg by mouth daily.    ? Ibuprofen 200 MG CAPS Take 400 mg by mouth 2 (two) times daily as needed.     ? insulin aspart (NOVOLOG FLEXPEN) 100 UNIT/ML FlexPen Inject 1-20 Units into the skin 3 (three) times daily as needed. Patient uses a carb ratio 1:10 units per meal or 4-6 units with food add correction of 2 units per 50 above 200 pt uses up to 20 units daily    ? Insulin Glargine (LANTUS SOLOSTAR) 100 UNIT/ML Solostar Pen Inject 5 Units into the skin daily as needed.    ?  lansoprazole (PREVACID) 30 MG capsule Take 30 mg by mouth daily.    ? levothyroxine (SYNTHROID) 125 MCG tablet Take 125 mcg by mouth every morning.    ? lisinopril (ZESTRIL) 10 MG tablet Take 10 mg by mouth daily.     ? metFORMIN (GLUCOPHAGE) 500 MG tablet Take 500 mg by mouth 2 (two) times daily with a meal.    ? Omega-3 Fatty Acids (FISH OIL) 1000 MG CAPS Take 1 capsule by mouth daily.    ? Probiotic Product (ALIGN) 4 MG CAPS Take by mouth daily.    ? SUMAtriptan (IMITREX) 100 MG tablet Take 1 tablet by mouth as needed.    ? venlafaxine XR (EFFEXOR-XR) 150 MG 24 hr capsule Take 150 mg by mouth daily.    ? Vitamin D, Ergocalciferol, (DRISDOL) 1.25 MG (50000 UNIT) CAPS capsule Take 50,000 Units by mouth. Two times per week.    ? ?No current facility-administered medications for this visit.  ? ? ?ALLERGIES:  ?Allergies  ?Allergen Reactions  ? Baclofen Nausea And Vomiting  ? ? ?PHYSICAL EXAM:  ?Performance status (ECOG): 1 - Symptomatic but completely ambulatory ? ?Vitals:  ? 05/25/21 1514  ?BP: 104/71  ?Pulse: 92  ?Resp: 20  ?Temp: (!) 97.2 ?F (36.2 ?C)  ?SpO2: 98%  ? ?Wt Readings from Last 3 Encounters:  ?05/25/21 156 lb 6.4 oz (70.9 kg)  ?01/19/21 156 lb 6.4 oz (70.9 kg)  ?11/03/20 156 lb (70.8 kg)  ? ?Physical Exam ?Vitals reviewed.  ?Constitutional:   ?   Appearance: Normal appearance.  ?   Comments: In wheelchair  ?Cardiovascular:  ?   Rate and Rhythm: Normal rate and regular rhythm.  ?   Pulses: Normal pulses.  ?   Heart  sounds: Normal heart sounds.  ?Pulmonary:  ?   Effort: Pulmonary effort is normal.  ?   Breath sounds: Normal breath sounds.  ?Lymphadenopathy:  ?   Upper Body:  ?   Right upper body: No axillary or pec

## 2021-05-25 NOTE — Patient Instructions (Signed)
[place] at [facility] ?Discharge Instructions ? ?You were seen and examined today by Dr. Delton Coombes. He reviewed your most recent labs and scan and everything looks good. Please keep follow up appointments as scheduled in 1 year. ? ? ?Thank you for choosing [place] at [facility] to provide your oncology and hematology care.  To afford each patient quality time with our provider, please arrive at least 15 minutes before your scheduled appointment time.  ? ?If you have a lab appointment with the [facility] please come in thru the Main Entrance and check in at the main information desk. ? ?You need to re-schedule your appointment should you arrive 10 or more minutes late.  We strive to give you quality time with our providers, and arriving late affects you and other patients whose appointments are after yours.  Also, if you no show three or more times for appointments you may be dismissed from the clinic at the providers discretion.     ?Again, thank you for choosing [facility].  Our hope is that these requests will decrease the amount of time that you wait before being seen by our physicians.       ?_____________________________________________________________ ? ?Should you have questions after your visit to [facility], please contact our office at [phone] and follow the prompts.  Our office hours are 8:00 a.m. and 4:30 p.m. Monday - Friday.  Please note that voicemails left after 4:00 p.m. may not be returned until the following business day.  We are closed weekends and major holidays.  You do have access to a nurse 24-7, just call the main number to the clinic [phone] and do not press any options, hold on the line and a nurse will answer the phone.   ? ?For prescription refill requests, have your pharmacy contact our office and allow 72 hours.   ? ?Due to Covid, you will need to wear a mask upon entering the hospital. If  you do not have a mask, a mask will be given to you at the Main Entrance upon arrival. For doctor visits, patients may have 1 support person age 31 or older with them. For treatment visits, patients can not have anyone with them due to social distancing guidelines and our immunocompromised population.  ? ?  ?

## 2022-01-02 ENCOUNTER — Other Ambulatory Visit (INDEPENDENT_AMBULATORY_CARE_PROVIDER_SITE_OTHER): Payer: Self-pay | Admitting: Gastroenterology

## 2022-03-06 ENCOUNTER — Other Ambulatory Visit (INDEPENDENT_AMBULATORY_CARE_PROVIDER_SITE_OTHER): Payer: Self-pay | Admitting: Gastroenterology

## 2022-04-07 ENCOUNTER — Other Ambulatory Visit (INDEPENDENT_AMBULATORY_CARE_PROVIDER_SITE_OTHER): Payer: Self-pay | Admitting: Gastroenterology

## 2022-04-19 ENCOUNTER — Other Ambulatory Visit: Payer: Self-pay | Admitting: Internal Medicine

## 2022-04-19 ENCOUNTER — Ambulatory Visit
Admission: RE | Admit: 2022-04-19 | Discharge: 2022-04-19 | Disposition: A | Discharge status: Home / Self Care | Payer: Medicare Other | Source: Ambulatory Visit | Attending: Internal Medicine | Admitting: Internal Medicine

## 2022-04-19 DIAGNOSIS — Z1231 Encounter for screening mammogram for malignant neoplasm of breast: Secondary | ICD-10-CM

## 2022-05-10 ENCOUNTER — Ambulatory Visit (HOSPITAL_COMMUNITY)
Admission: RE | Admit: 2022-05-10 | Discharge: 2022-05-10 | Disposition: A | Discharge status: Home / Self Care | Payer: Medicare Other | Source: Ambulatory Visit | Attending: Hematology | Admitting: Hematology

## 2022-05-10 ENCOUNTER — Inpatient Hospital Stay: Payer: Medicare Other | Attending: Hematology

## 2022-05-10 DIAGNOSIS — F17210 Nicotine dependence, cigarettes, uncomplicated: Secondary | ICD-10-CM | POA: Insufficient documentation

## 2022-05-10 DIAGNOSIS — E039 Hypothyroidism, unspecified: Secondary | ICD-10-CM | POA: Diagnosis not present

## 2022-05-10 DIAGNOSIS — K219 Gastro-esophageal reflux disease without esophagitis: Secondary | ICD-10-CM | POA: Diagnosis not present

## 2022-05-10 DIAGNOSIS — E1159 Type 2 diabetes mellitus with other circulatory complications: Secondary | ICD-10-CM | POA: Diagnosis not present

## 2022-05-10 DIAGNOSIS — Z79899 Other long term (current) drug therapy: Secondary | ICD-10-CM | POA: Insufficient documentation

## 2022-05-10 DIAGNOSIS — E673 Hypervitaminosis D: Secondary | ICD-10-CM | POA: Diagnosis not present

## 2022-05-10 DIAGNOSIS — D72829 Elevated white blood cell count, unspecified: Secondary | ICD-10-CM | POA: Insufficient documentation

## 2022-05-10 DIAGNOSIS — E1140 Type 2 diabetes mellitus with diabetic neuropathy, unspecified: Secondary | ICD-10-CM | POA: Diagnosis not present

## 2022-05-10 DIAGNOSIS — Z8673 Personal history of transient ischemic attack (TIA), and cerebral infarction without residual deficits: Secondary | ICD-10-CM | POA: Insufficient documentation

## 2022-05-10 DIAGNOSIS — I10 Essential (primary) hypertension: Secondary | ICD-10-CM | POA: Diagnosis not present

## 2022-05-10 DIAGNOSIS — I959 Hypotension, unspecified: Secondary | ICD-10-CM | POA: Insufficient documentation

## 2022-05-10 LAB — CBC WITH DIFFERENTIAL/PLATELET
Abs Immature Granulocytes: 0.05 10*3/uL (ref 0.00–0.07)
Basophils Absolute: 0.1 10*3/uL (ref 0.0–0.1)
Basophils Relative: 1 %
Eosinophils Absolute: 0.3 10*3/uL (ref 0.0–0.5)
Eosinophils Relative: 2 %
HCT: 44.6 % (ref 36.0–46.0)
Hemoglobin: 14.8 g/dL (ref 12.0–15.0)
Immature Granulocytes: 1 %
Lymphocytes Relative: 35 %
Lymphs Abs: 3.8 10*3/uL (ref 0.7–4.0)
MCH: 30.2 pg (ref 26.0–34.0)
MCHC: 33.2 g/dL (ref 30.0–36.0)
MCV: 91 fL (ref 80.0–100.0)
Monocytes Absolute: 0.8 10*3/uL (ref 0.1–1.0)
Monocytes Relative: 8 %
Neutro Abs: 5.8 10*3/uL (ref 1.7–7.7)
Neutrophils Relative %: 53 %
Platelets: 406 10*3/uL — ABNORMAL HIGH (ref 150–400)
RBC: 4.9 MIL/uL (ref 3.87–5.11)
RDW: 13 % (ref 11.5–15.5)
WBC: 10.9 10*3/uL — ABNORMAL HIGH (ref 4.0–10.5)
nRBC: 0 % (ref 0.0–0.2)

## 2022-05-10 LAB — VITAMIN D 25 HYDROXY (VIT D DEFICIENCY, FRACTURES): Vit D, 25-Hydroxy: 106.46 ng/mL — ABNORMAL HIGH (ref 30–100)

## 2022-05-10 LAB — LACTATE DEHYDROGENASE: LDH: 121 U/L (ref 98–192)

## 2022-05-16 ENCOUNTER — Other Ambulatory Visit (INDEPENDENT_AMBULATORY_CARE_PROVIDER_SITE_OTHER): Payer: Self-pay | Admitting: Gastroenterology

## 2022-05-31 NOTE — Progress Notes (Signed)
[place], [place] [address]   CLINIC:  Medical Oncology/Hematology  PCP:  Glenda Chroman, MD [address] [place] [address] [phone]   REASON FOR VISIT:  Follow-up for leukocytosis  PRIOR THERAPY: None  CURRENT THERAPY: Surveillance  INTERVAL HISTORY:   [name] 67 y.o. female returns for routine follow-up of leukocytosis.  She was last seen by Dr. Delton Coombes on 05/25/2021.   At today's visit, she reports feeling fairly well, although she is under work-up for possible early dementia.  No recent hospitalizations, surgeries, or other changes in baseline health status.  She continues to smoke 1 PPD cigarettes.  She denies fevers, night sweats, and unintentional weight loss.  She denies any recent infections or steroids.  She denies any new masses or lymphadenopathy.  She has little to no energy and 100% appetite. She endorses that she is maintaining a stable weight.  ASSESSMENT & PLAN:  1.  Leukocytosis - Leukocytosis since at least 2021 with WBC ranging from 10.7-14.9, differential showing intermittently elevated neutrophils and lymphocytes.  (Previous labs available in Care Everywhere show leukocytosis since at least 2016) - BCR/ABL was negative by FISH.  JAK2 V6 49F and reflex testing was negative.  Flow cytometry negative for any lymphoproliferative disorders.  Normal LDH, CRP, ANA, RF. - She denies any chronic systemic steroid use.  Denies any splenectomy.  She was treated with antibiotics for her elevated white count several times over the last few years. - She is current active smoker (1 PPD)  - No B symptoms or recurrent infections. - Physical exam negative for any masses or lymphadenopathy.   - Likely etiology of leukocytosis is reactive from smoking.   - Most recent labs (05/10/2022): WBC 10.9, normal differential.  Platelets 406.  Normal LDH. - PLAN: Myeloproliferative disorder has been ruled out.  Likely etiology of leukocytosis is  reactive from smoking.  MPN workup has been negative and patient demonstrates long-term stability since at least 2016. Therefore, we will discharge patient from clinic.  She   should continue to follow with her  PCP with CBC checked at least annually.  She  can be referred back to [place] in the future if WBC persistently >20,000 (in the absence of acute infection) or if he develops unexplained B symptoms in the presence of any amount of leukocytosis.  2.  Tobacco use - She continues to smoke 1 PPD cigarettes - Most recent LDCT chest for lung cancer screening (05/11/2022): Lung RADS 2, benign appearance or behavior.  Other findings include coronary artery calcifications, aortic atherosclerosis, and emphysema. - PLAN: Discussed smoking cessation, but she is not interested at this time and is very adamant that she wants to continue smoking indefinitely.   Continue annual LDCT chest for lung cancer screening.  3.  Vitamin D elevation - Vitamin D (05/10/2022) elevated at 106.46 - Patient currently takes vitamin D 50,000 twice weekly - PLAN: Recommend holding vitamin D for 1 month, and then restarting at once weekly dosing.  Patient's PCP can follow-up on vitamin D levels.  4.  Low blood pressure - BP today 89/67, heart rate 111 - Patient reports that she drinks little to no water, and her husband reports that she "lives on cigarettes and Coca-Cola" - PLAN: Educated on adequate hydration with strict instructions for PCP follow-up.  5.  Other history - PMH includes type 2 diabetes mellitus, fibromyalgia, GERD, history of CVA, anxiety, hyperlipidemia, hypothyroidism, hypertension, neuropathy  PLAN SUMMARY: >> Tentative discharge from clinic >> Continue annual LDCT chest for LCS     REVIEW OF SYSTEMS:   Review of Systems  Constitutional:  Positive for fatigue. Negative for appetite change, chills, diaphoresis, fever and unexpected weight change.  HENT:   Negative for lump/mass and nosebleeds.    Eyes:  Negative for eye problems.  Respiratory:  Positive for cough and shortness of breath. Negative for hemoptysis.   Cardiovascular:  Negative for chest pain, leg swelling and palpitations.  Gastrointestinal:  Positive for abdominal pain, nausea and vomiting. Negative for blood in stool, constipation and diarrhea.  Genitourinary:  Positive for dysuria. Negative for hematuria.   Skin: Negative.   Neurological:  Positive for dizziness, headaches, light-headedness and numbness.  Hematological:  Does not bruise/bleed easily.     PHYSICAL EXAM:  ECOG PERFORMANCE STATUS: 2 - Symptomatic, <50% confined to bed  There were no vitals filed for this visit. There were no vitals filed for this visit. Physical Exam Constitutional:      Appearance: Normal appearance. She is normal weight.  Cardiovascular:     Rate and Rhythm: Tachycardia present.     Heart sounds: Normal heart sounds.  Pulmonary:     Breath sounds: Normal breath sounds. Decreased air movement present.  Neurological:     General: No focal deficit present.     Mental Status: Mental status is at baseline.  Psychiatric:        Behavior: Behavior normal. Behavior is cooperative.     PAST MEDICAL/SURGICAL HISTORY:  Past Medical History:  Diagnosis Date   Anxiety    CVA (cerebral vascular accident) ([place])    right sided weakness, with ling term memory loss.   Diabetes mellitus type II, controlled ([place])    Fibromyalgia    GERD (gastroesophageal reflux disease)    Hypercholesteremia    Hypertension    Hypothyroidism    Neuropathy    Past Surgical History:  Procedure Laterality Date   ABDOMINAL HYSTERECTOMY     APPENDECTOMY     BIOPSY  10/20/2018   Procedure: BIOPSY;  Surgeon: Rogene Houston, MD;  [facility];  Service: Endoscopy;;  gastric   BIOPSY  10/03/2020   Procedure: BIOPSY;  Surgeon: Harvel Quale, MD;  [facility];  Service: Gastroenterology;;   CHOLECYSTECTOMY      COLONOSCOPY WITH PROPOFOL N/A 10/20/2018   Procedure: COLONOSCOPY WITH PROPOFOL;  Surgeon: Rogene Houston, MD;  [facility];  Service: Endoscopy;  Laterality: N/A;   COLONOSCOPY WITH PROPOFOL N/A 10/03/2020   Procedure: COLONOSCOPY WITH PROPOFOL;  Surgeon: Harvel Quale, MD;  [facility];  Service: Gastroenterology;  Laterality: N/A;  10:55   ESOPHAGOGASTRODUODENOSCOPY (EGD) WITH PROPOFOL N/A 10/20/2018   Procedure: ESOPHAGOGASTRODUODENOSCOPY (EGD) WITH PROPOFOL;  Surgeon: Rogene Houston, MD;  [facility];  Service: Endoscopy;  Laterality: N/A;  1220   POLYPECTOMY  10/20/2018   Procedure: POLYPECTOMY;  Surgeon: Rogene Houston, MD;  [facility];  Service: Endoscopy;;  colon    POLYPECTOMY  10/03/2020   Procedure: POLYPECTOMY INTESTINAL;  Surgeon: Harvel Quale, MD;  [facility];  Service: Gastroenterology;;   TONSILLECTOMY     TUBAL LIGATION      SOCIAL HISTORY:  Social History   Socioeconomic History   Marital status: Married    Spouse name: Jeneen Rinks    Number of children: 1   Years of education: Not on file   Highest  education level: Not on file  Occupational History   Occupation: unemployed  Tobacco Use   Smoking status: Every Day    Packs/day: 0.25    Years: 4.00    Additional pack years: 0.00    Total pack years: 1.00    Types: Cigarettes    Start date: 03/08/1978    Last attempt to quit: 10/18/2010    Years since quitting: 11.6   Smokeless tobacco: Never  Vaping Use   Vaping Use: Never used  Substance and Sexual Activity   Alcohol use: Not Currently   Drug use: Never   Sexual activity: Not Currently    Birth control/protection: Surgical  Other Topics Concern   Not on file  Social History Narrative   Not on file   Social Determinants of Health   Financial Resource Strain: Not on file  Food Insecurity: Not on file  Transportation Needs: Not on file  Physical Activity: Not on file   Stress: Not on file  Social Connections: Not on file  Intimate Partner Violence: Not on file    FAMILY HISTORY:  Family History  Problem Relation Age of Onset   Colon cancer Mother    Microcephaly Father    Breast cancer Maternal Aunt    Colon cancer Maternal Grandfather    Colon cancer Other     CURRENT MEDICATIONS:  Outpatient Encounter Medications as of 06/01/2022  Medication Sig Note   aspirin 325 MG tablet Take 1 tablet (325 mg total) by mouth daily.    atorvastatin (LIPITOR) 40 MG tablet Take 40 mg by mouth daily.    b complex vitamins capsule Take 1 capsule by mouth daily.    BD PEN NEEDLE NANO 2ND GEN 32G X 4 MM MISC USE TWICE DAILY AS DIRECTED 90    Black Cohosh 540 MG CAPS Take 1 capsule by mouth daily.    canagliflozin (INVOKANA) 100 MG TABS tablet Take 100 mg by mouth daily.    carvedilol (COREG) 3.125 MG tablet Take 3.125 mg by mouth 2 (two) times daily with a meal.    clonazePAM (KLONOPIN) 1 MG tablet Take 1 mg by mouth 3 (three) times daily as needed for anxiety.    clopidogrel (PLAVIX) 75 MG tablet Take 75 mg by mouth daily.    clotrimazole-betamethasone (LOTRISONE) cream Apply topically as needed.    Continuous Blood Gluc Receiver (DEXCOM G6 RECEIVER) DEVI Inject into the skin daily.    Continuous Blood Gluc Sensor (DEXCOM G6 SENSOR) MISC SMARTSIG:1 Each Topical Every 10 Days    Continuous Blood Gluc Transmit (DEXCOM G6 TRANSMITTER) MISC Inject into the skin daily.    dicyclomine (BENTYL) 10 MG capsule TAKE 1 CAPSULE BY MOUTH 3 TIMES DAILY BEFORE MEALS. DUE FOR YEARLY OFFICE VISIT 01/19/2021: One bid   DULoxetine (CYMBALTA) 20 MG capsule Take 20 mg by mouth daily.    ergocalciferol (VITAMIN D2) 1.25 MG (50000 UT) capsule Take 50,000 Units by mouth. Takes one tablet twice weekly    fenofibrate (TRICOR) 145 MG tablet Take 145 mg by mouth daily.    folic acid (FOLVITE) Q000111Q MCG tablet Take 400 mcg by mouth daily.    gabapentin (NEURONTIN) 300 MG capsule Take 300 mg  by mouth 3 (three) times daily.    glucose blood (ACCU-CHEK GUIDE) test strip USE TO CHECK SUGAR 4 TIMES DAILY    hydrochlorothiazide (HYDRODIURIL) 25 MG tablet Take 25 mg by mouth daily.    Ibuprofen 200 MG CAPS Take 400 mg by mouth 2 (two) times  daily as needed.     insulin aspart (NOVOLOG FLEXPEN) 100 UNIT/ML FlexPen Inject 1-20 Units into the skin 3 (three) times daily as needed. Patient uses a carb ratio 1:10 units per meal or 4-6 units with food add correction of 2 units per 50 above 200 pt uses up to 20 units daily    Insulin Glargine (LANTUS SOLOSTAR) 100 UNIT/ML Solostar Pen Inject 5 Units into the skin daily as needed. 10/18/2018: Per patient only takes as needed if blood glucose is elevated   lansoprazole (PREVACID) 30 MG capsule Take 30 mg by mouth daily.    levothyroxine (SYNTHROID) 125 MCG tablet Take 125 mcg by mouth every morning.    lisinopril (ZESTRIL) 10 MG tablet Take 10 mg by mouth daily.     metFORMIN (GLUCOPHAGE) 500 MG tablet Take 500 mg by mouth 2 (two) times daily with a meal.    Omega-3 Fatty Acids (FISH OIL) 1000 MG CAPS Take 1 capsule by mouth daily. 01/19/2021: 2,000 mg daily   Probiotic Product (ALIGN) 4 MG CAPS Take by mouth daily.    SUMAtriptan (IMITREX) 100 MG tablet Take 1 tablet by mouth as needed.    venlafaxine XR (EFFEXOR-XR) 150 MG 24 hr capsule Take 150 mg by mouth daily.    Vitamin D, Ergocalciferol, (DRISDOL) 1.25 MG (50000 UNIT) CAPS capsule Take 50,000 Units by mouth. Two times per week.    No facility-administered encounter medications on file as of 06/01/2022.    ALLERGIES:  Allergies  Allergen Reactions   Baclofen Nausea And Vomiting    LABORATORY DATA:  I have reviewed the labs as listed.  CBC    Component Value Date/Time   WBC 10.9 (H) 05/10/2022 1354   RBC 4.90 05/10/2022 1354   HGB 14.8 05/10/2022 1354   HCT 44.6 05/10/2022 1354   PLT 406 (H) 05/10/2022 1354   MCV 91.0 05/10/2022 1354   MCH 30.2 05/10/2022 1354   MCHC 33.2  05/10/2022 1354   RDW 13.0 05/10/2022 1354   LYMPHSABS 3.8 05/10/2022 1354   MONOABS 0.8 05/10/2022 1354   EOSABS 0.3 05/10/2022 1354   BASOSABS 0.1 05/10/2022 1354      Latest Ref Rng & Units 05/04/2021    3:36 PM 10/01/2020   10:14 AM 04/25/2019   12:08 PM  CMP  Glucose 70 - 99 mg/dL 137  151  119   BUN 8 - 23 mg/dL 12  19  10    Creatinine 0.44 - 1.00 mg/dL 0.76  0.86  0.79   Sodium 135 - 145 mmol/L 138  138  140   Potassium 3.5 - 5.1 mmol/L 4.3  4.7  4.7   Chloride 98 - 111 mmol/L 106  105  106   CO2 22 - 32 mmol/L 22  25  24    Calcium 8.9 - 10.3 mg/dL 10.1  11.6  10.1   Total Protein 6.5 - 8.1 g/dL 7.3   7.3   Total Bilirubin 0.3 - 1.2 mg/dL 0.6   0.3   Alkaline Phos 38 - 126 U/L 68   63   AST 15 - 41 U/L 17   23   ALT 0 - 44 U/L 11   21     DIAGNOSTIC IMAGING:  I have independently reviewed the relevant imaging and discussed with the patient.   WRAP UP:  All questions were answered. The patient knows to call the clinic with any problems, questions or concerns.  Medical decision making: Moderate  Time spent on visit: I  spent 20 minutes counseling the patient face to face. The total time spent in the appointment was 30 minutes and more than 50% was on counseling.  Harriett Rush, PA-C  06/01/2022 3:25 PM

## 2022-06-01 ENCOUNTER — Inpatient Hospital Stay (HOSPITAL_BASED_OUTPATIENT_CLINIC_OR_DEPARTMENT_OTHER): Payer: Medicare Other | Admitting: Physician Assistant

## 2022-06-01 VITALS — BP 89/67 | HR 111 | Temp 97.4°F | Resp 19 | Ht 64.0 in | Wt 152.0 lb

## 2022-06-01 DIAGNOSIS — Z87891 Personal history of nicotine dependence: Secondary | ICD-10-CM | POA: Diagnosis not present

## 2022-06-01 DIAGNOSIS — D72829 Elevated white blood cell count, unspecified: Secondary | ICD-10-CM

## 2022-06-01 NOTE — Patient Instructions (Signed)
[place] at [facility] **   You were seen today by Tarri Abernethy PA-C for your follow-up visit.    ELEVATED WHITE BLOOD CELLS:  Based on the labs that we checked, your white blood cell count does not show any signs of blood cancer.  We suspect that your white blood cells are elevated due to chronic cigarette smoking.  You do not need any further testing or appointments at the hematology clinic [facility]). You should continue to follow-up with your primary care doctor and should have your blood counts checked at least once per year. Your primary care doctor can refer you back to see [place] in the future if needed if you have persistently elevated white blood cells > 20 (other than during active infections), or if you have any elevation in blood cells with unexplained fever, chills, night sweats, or weight loss  VITAMIN D OVERLOAD: Your vitamin D levels are very high. Your current taking vitamin D 50,000 units twice a week. I would like you to STOP taking vitamin D for the next month. In one month, you can restart vitamin D but should only take it ONCE a week. Your primary care doctor can monitor your vitamin D levels to make sure that they are coming down appropriately.  LUNG CANCER SCREENING Even though you do not have any follow-up appointment scheduled at the [facility], you will still be on our list for annual CT scan of your chest for lung cancer screening. You continue to be at high risk for lung cancer and other health conditions due to your cigarette smoking.  Please see the attached handout for important information regarding cigarettes and smoking cessation.  LOW BLOOD PRESSURE: You had low blood pressure (89/67) and high heart rate (111).  This is most likely due to dehydration. Make sure that you are drinking 64 ounces of water daily.  Cut back on caffeinated sodas like Coca-Cola, since this will  further dehydrate you. Check your blood pressure daily at home. Please reach out to your primary care doctor to see if you need any further adjustments in your blood pressure medication.  ** Thank you for trusting me with your healthcare!  I strive to provide all of my patients with quality care at each visit.  If you receive a survey for this visit, I would be so grateful to you for taking the time to provide feedback.  Thank you in advance!  ~ Aiken Withem                   Dr. Derek Jack   &   Tarri Abernethy, PA-C   - - - - - - - - - - - - - - - - - -    Thank you for choosing [place] at [facility] to provide your oncology and hematology care.  To afford each patient quality time with our provider, please arrive at least 15 minutes before your scheduled appointment time.   If you have a lab appointment with the [facility] please come in thru the Main Entrance and check in at the main information desk.  You need to re-schedule your appointment should you arrive 10 or more minutes late.  We strive to give you quality time with our providers, and arriving late affects you and other patients whose appointments are after yours.  Also, if you no show three or more times for  appointments you may be dismissed from the clinic at the providers discretion.     Again, thank you for choosing [facility].  Our hope is that these requests will decrease the amount of time that you wait before being seen by our physicians.       _____________________________________________________________  Should you have questions after your visit to [facility], please contact our office at [phone] and follow the prompts.  Our office hours are 8:00 a.m. and 4:30 p.m. Monday - Friday.  Please note that voicemails left after 4:00 p.m. may not be returned until the following business day.  We are closed weekends and major holidays.  You do have access to a  nurse 24-7, just call the main number to the clinic [phone] and do not press any options, hold on the line and a nurse will answer the phone.    For prescription refill requests, have your pharmacy contact our office and allow 72 hours.

## 2022-06-11 NOTE — Progress Notes (Signed)
Patient notified of LDCT Lung Cancer Screening Results via mail with the recommendation to follow-up in 12 months. Patient's referring provider has been sent a copy of results. Results are as follows:  IMPRESSION: 1. Lung-RADS 2, benign appearance or behavior. Continue annual screening with low-dose chest CT without contrast in 12 months. 2. Coronary artery calcifications, aortic Atherosclerosis (ICD10-I70.0) and Emphysema (ICD10-J43.9). 

## 2022-06-17 ENCOUNTER — Other Ambulatory Visit (INDEPENDENT_AMBULATORY_CARE_PROVIDER_SITE_OTHER): Payer: Self-pay | Admitting: Gastroenterology

## 2022-09-06 ENCOUNTER — Other Ambulatory Visit (INDEPENDENT_AMBULATORY_CARE_PROVIDER_SITE_OTHER): Payer: Self-pay | Admitting: Gastroenterology

## 2022-09-07 NOTE — Telephone Encounter (Signed)
Will refill for 3 months.

## 2022-10-18 ENCOUNTER — Encounter (INDEPENDENT_AMBULATORY_CARE_PROVIDER_SITE_OTHER): Payer: Self-pay | Admitting: Gastroenterology

## 2022-10-18 ENCOUNTER — Ambulatory Visit (INDEPENDENT_AMBULATORY_CARE_PROVIDER_SITE_OTHER): Payer: Medicare Other | Admitting: Gastroenterology

## 2022-10-18 VITALS — BP 116/69 | HR 93 | Temp 97.1°F | Ht 64.0 in | Wt 148.9 lb

## 2022-10-18 DIAGNOSIS — K582 Mixed irritable bowel syndrome: Secondary | ICD-10-CM

## 2022-10-18 NOTE — Patient Instructions (Signed)
Take dicyclomine as needed only if you have abdominal pain Continue with MiraLAX daily

## 2022-10-18 NOTE — Progress Notes (Signed)
Amber Hawkins, M.D. Gastroenterology & Hepatology [facility] [address] [place], [place] [address]  Primary Care Physician: Amber Specking, MD [address] [place] [address]  I will communicate my assessment and recommendations to the referring MD via EMR.  Problems: IBS-M  History of Present Illness: Amber Hawkins is a 67 y.o. female with past medical history of CVA, diabetes, fibromyalgia, GERD, hypertension, hyperlipidemia, hypothyroidism, anxiety and IBS, who presents for follow up of IBS M.   The patient was last seen on 01/19/2021. At that time, the patient was advised to continue MiraLAX and Bentyl as needed.  Also advised to follow a low FODMAP diet.  Reports having some nausea in the past but this is not happening very frequently.  Denies any vomiting. Husband reports that she had an episode of constipation as she was missing her Miralax doses, but now he has straightened out and is much better compared to prior. She is moving her bowels regularly, at least once a day. The patient denies having any nausea, vomiting, fever, chills, hematochezia, melena, hematemesis, abdominal distention, abdominal pain, diarrhea, jaundice, pruritus or weight loss.  Most recent labs from 05/10/2022 showed a WBC of 10.9, hemoglobin 14.8, platelets 406.  Last EGD: 10/20/2018  - Normal esophagus. - Z-line irregular, 39 cm from the incisors. - Gastritis. Biopsied. - Normal duodenal bulb and second portion of the duodenum.   1. Stomach, biopsy - ANTRAL AND OXYNTIC MUCOSA WITH SLIGHT CHRONIC INFLAMMATION AND HYPEREMIA. - WARTHIN-STARRY NEGATIVE FOR HELICOBACTER PYLORI. - NO INTESTINAL METAPLASIA, DYSPLASIA OR MALIGNANCY.   Last Colonoscopy: 10/03/2020 There was presence of three 3 to 5 mm polyps in the descending and ascending colon removed with a cold snare.  Pathology showed presence of masses and serrated polyp, tubular adenoma and hyperplastic  polyp.  Diverticulosis in the descending and ascending colon.  Random colonic biopsies were negative for microscopic colitis.  Recommend to have repeat colonoscopy in 5 years.  Past Medical History: Past Medical History:  Diagnosis Date   Anxiety    CVA (cerebral vascular accident) (HCC)    right sided weakness, with ling term memory loss.   Diabetes mellitus type II, controlled (HCC)    Fibromyalgia    GERD (gastroesophageal reflux disease)    Hypercholesteremia    Hypertension    Hypothyroidism    Neuropathy     Past Surgical History: Past Surgical History:  Procedure Laterality Date   ABDOMINAL HYSTERECTOMY     APPENDECTOMY     BIOPSY  10/20/2018   Procedure: BIOPSY;  Surgeon: Malissa Hippo, MD;  [facility];  Service: Endoscopy;;  gastric   BIOPSY  10/03/2020   Procedure: BIOPSY;  Surgeon: Dolores Frame, MD;  [facility];  Service: Gastroenterology;;   CHOLECYSTECTOMY     COLONOSCOPY WITH PROPOFOL N/A 10/20/2018   Procedure: COLONOSCOPY WITH PROPOFOL;  Surgeon: Malissa Hippo, MD;  [facility];  Service: Endoscopy;  Laterality: N/A;   COLONOSCOPY WITH PROPOFOL N/A 10/03/2020   Procedure: COLONOSCOPY WITH PROPOFOL;  Surgeon: Dolores Frame, MD;  [facility];  Service: Gastroenterology;  Laterality: N/A;  10:55   ESOPHAGOGASTRODUODENOSCOPY (EGD) WITH PROPOFOL N/A 10/20/2018   Procedure: ESOPHAGOGASTRODUODENOSCOPY (EGD) WITH PROPOFOL;  Surgeon: Malissa Hippo, MD;  [facility];  Service: Endoscopy;  Laterality: N/A;  1220   POLYPECTOMY  10/20/2018   Procedure: POLYPECTOMY;  Surgeon: Malissa Hippo, MD;  [facility];  Service: Endoscopy;;  colon    POLYPECTOMY  10/03/2020   Procedure: POLYPECTOMY INTESTINAL;  Surgeon: Dolores Frame, MD;  [facility];  Service: Gastroenterology;;   TONSILLECTOMY     TUBAL LIGATION      Family History: Family History   Problem Relation Age of Onset   Colon cancer Mother    Microcephaly Father    Breast cancer Maternal Aunt    Colon cancer Maternal Grandfather    Colon cancer Other     Social History: Social History   Tobacco Use  Smoking Status Every Day   Current packs/day: 0.00   Average packs/day: 0.3 packs/day for 32.6 years (8.2 ttl pk-yrs)   Types: Cigarettes   Start date: 03/08/1978   Last attempt to quit: 10/18/2010   Years since quitting: 12.0  Smokeless Tobacco Never   Social History   Substance and Sexual Activity  Alcohol Use Not Currently   Social History   Substance and Sexual Activity  Drug Use Never    Allergies: Allergies  Allergen Reactions   Baclofen Nausea And Vomiting    Medications: Current Outpatient Medications  Medication Sig Dispense Refill   aspirin 325 MG tablet Take 1 tablet (325 mg total) by mouth daily.     atorvastatin (LIPITOR) 40 MG tablet Take 40 mg by mouth daily.     b complex vitamins capsule Take 1 capsule by mouth daily.     BD PEN NEEDLE NANO 2ND GEN 32G X 4 MM MISC USE TWICE DAILY AS DIRECTED 90     Black Cohosh 540 MG CAPS Take 1 capsule by mouth daily.     canagliflozin (INVOKANA) 100 MG TABS tablet Take 100 mg by mouth daily.     carvedilol (COREG) 3.125 MG tablet Take 3.125 mg by mouth 2 (two) times daily with a meal.     clonazePAM (KLONOPIN) 1 MG tablet Take 1 mg by mouth 3 (three) times daily as needed for anxiety.     clopidogrel (PLAVIX) 75 MG tablet Take 75 mg by mouth daily.     clotrimazole-betamethasone (LOTRISONE) cream Apply topically as needed.     Continuous Blood Gluc Receiver (DEXCOM G6 RECEIVER) DEVI Inject into the skin daily.     Continuous Blood Gluc Sensor (DEXCOM G6 SENSOR) MISC SMARTSIG:1 Each Topical Every 10 Days     Continuous Blood Gluc Transmit (DEXCOM G6 TRANSMITTER) MISC Inject into the skin daily.     dicyclomine (BENTYL) 10 MG capsule Take 1 capsule (10 mg total) by mouth every 8 (eight) hours as needed  for spasms (abdominal pain). 270 capsule 0   DULoxetine (CYMBALTA) 20 MG capsule Take 20 mg by mouth daily.     ergocalciferol (VITAMIN D2) 1.25 MG (50000 UT) capsule Take 50,000 Units by mouth once a week. Takes one tablet twice weekly     fenofibrate (TRICOR) 145 MG tablet Take 145 mg by mouth daily.     folic acid (FOLVITE) 800 MCG tablet Take 400 mcg by mouth daily.     gabapentin (NEURONTIN) 300 MG capsule Take 300 mg by mouth 3 (three) times daily.     glucose blood (ACCU-CHEK GUIDE) test strip USE TO CHECK SUGAR 4 TIMES DAILY     hydrochlorothiazide (HYDRODIURIL) 25 MG tablet Take 25 mg by mouth daily.     Ibuprofen 200 MG CAPS Take 400 mg by mouth 2 (two) times daily as needed.      insulin aspart (NOVOLOG FLEXPEN) 100 UNIT/ML FlexPen Inject 1-20 Units  into the skin 3 (three) times daily as needed. Patient uses a carb ratio 1:10 units per meal or 4-6 units with food add correction of 2 units per 50 above 200 pt uses up to 20 units daily     Insulin Glargine (LANTUS SOLOSTAR) 100 UNIT/ML Solostar Pen Inject 5 Units into the skin daily as needed.     lansoprazole (PREVACID) 30 MG capsule Take 30 mg by mouth daily.     levothyroxine (SYNTHROID) 125 MCG tablet Take 125 mcg by mouth every morning.     lisinopril (ZESTRIL) 2.5 MG tablet 1 tablet Orally Once a day for 30 days     melatonin 3 MG TABS tablet 1 tablet at bedtime as needed Orally Once a day     memantine (NAMENDA) 5 MG tablet Take 10 mg by mouth 2 (two) times daily.     metFORMIN (GLUCOPHAGE) 500 MG tablet Take 500 mg by mouth 2 (two) times daily with a meal.     Omega-3 Fatty Acids (FISH OIL) 1000 MG CAPS Take 1 capsule by mouth daily.     SUMAtriptan (IMITREX) 100 MG tablet Take 1 tablet by mouth as needed.     venlafaxine XR (EFFEXOR-XR) 150 MG 24 hr capsule Take 150 mg by mouth daily.     Vitamin D, Ergocalciferol, (DRISDOL) 1.25 MG (50000 UNIT) CAPS capsule Take 50,000 Units by mouth every 7 (seven) days. Two times per week.      Probiotic Product (ALIGN) 4 MG CAPS Take by mouth daily. (Patient not taking: Reported on 10/18/2022)     No current facility-administered medications for this visit.    Review of Systems: GENERAL: negative for malaise, night sweats HEENT: No changes in hearing or vision, no nose bleeds or other nasal problems. NECK: Negative for lumps, goiter, pain and significant neck swelling RESPIRATORY: Negative for cough, wheezing CARDIOVASCULAR: Negative for chest pain, leg swelling, palpitations, orthopnea GI: SEE HPI MUSCULOSKELETAL: Negative for joint pain or swelling, back pain, and muscle pain. SKIN: Negative for lesions, rash PSYCH: Negative for sleep disturbance, mood disorder and recent psychosocial stressors. HEMATOLOGY Negative for prolonged bleeding, bruising easily, and swollen nodes. ENDOCRINE: Negative for cold or heat intolerance, polyuria, polydipsia and goiter. NEURO: negative for tremor, gait imbalance, syncope and seizures. The remainder of the review of systems is noncontributory.   Physical Exam: BP 116/69 (BP Location: Right Arm, Patient Position: Sitting, Cuff Size: Normal)   Pulse 93   Temp (!) 97.1 F (36.2 C) (Temporal)   Ht 5\' 4"  (1.626 m)   Wt 148 lb 14.4 oz (67.5 kg)   BMI 25.56 kg/m  GENERAL: The patient is AO x3, in no acute distress. HEENT: Head is normocephalic and atraumatic. EOMI are intact. Mouth is well hydrated and without lesions. NECK: Supple. No masses LUNGS: Clear to auscultation. No presence of rhonchi/wheezing/rales. Adequate chest expansion HEART: RRR, normal s1 and s2. ABDOMEN: Soft, nontender, no guarding, no peritoneal signs, and nondistended. BS +. No masses. EXTREMITIES: Without any cyanosis, clubbing, rash, lesions or edema. NEUROLOGIC: AOx3, no focal motor deficit. SKIN: no jaundice, no rashes  Imaging/Labs: as above  I personally reviewed and interpreted the available labs, imaging and endoscopic files.  Impression and  Plan: Brystol Gailey is a 67 y.o. female with past medical history of CVA, diabetes, fibromyalgia, GERD, hypertension, hyperlipidemia, hypothyroidism, anxiety and IBS, who presents for follow up of IBS M.  The patient has presented improvement of her bowel movement frequency while taking MiraLAX as needed without presented  any more episodes of constipation.  We discussed in detail that this is adequate and she should continue with the same regimen for now.  I advised her to take Bentyl as needed instead of taking it standing as this medication is meant for symptom/pain control relief.  - Take dicyclomine as needed only if you have abdominal pain - Continue with MiraLAX daily  All questions were answered.      Amber Blazing, MD Gastroenterology and Hepatology [facility]

## 2022-10-24 ENCOUNTER — Other Ambulatory Visit (INDEPENDENT_AMBULATORY_CARE_PROVIDER_SITE_OTHER): Payer: Self-pay | Admitting: Gastroenterology

## 2022-11-02 ENCOUNTER — Encounter: Payer: Self-pay | Admitting: Diagnostic Neuroimaging

## 2022-11-02 ENCOUNTER — Ambulatory Visit (INDEPENDENT_AMBULATORY_CARE_PROVIDER_SITE_OTHER): Payer: Medicare Other | Admitting: Diagnostic Neuroimaging

## 2022-11-02 ENCOUNTER — Telehealth: Payer: Self-pay | Admitting: Diagnostic Neuroimaging

## 2022-11-02 VITALS — BP 92/64 | HR 94 | Ht 64.0 in | Wt 149.0 lb

## 2022-11-02 DIAGNOSIS — R413 Other amnesia: Secondary | ICD-10-CM | POA: Diagnosis not present

## 2022-11-02 DIAGNOSIS — R269 Unspecified abnormalities of gait and mobility: Secondary | ICD-10-CM

## 2022-11-02 DIAGNOSIS — G309 Alzheimer's disease, unspecified: Secondary | ICD-10-CM

## 2022-11-02 DIAGNOSIS — F03B0 Unspecified dementia, moderate, without behavioral disturbance, psychotic disturbance, mood disturbance, and anxiety: Secondary | ICD-10-CM

## 2022-11-02 NOTE — Patient Instructions (Signed)
  MEMORY LOSS / GAIT DIFF / FATIGUE (mmse 16/30) - check MRI brain - check labs - continue memantine; may increase up to 10mg  twice a day - safety / supervision issues reviewed - daily physical activity / exercise (at least 15-30 minutes) - eat more plants / vegetables - increase social activities, brain stimulation, games, puzzles, hobbies, crafts, arts, music - aim for at least 7-8 hours sleep per night (or more) - avoid smoking and alcohol - caution with medications, finances; no driving - use walker; consider PT

## 2022-11-02 NOTE — Telephone Encounter (Signed)
medicare/[employer] NPR sent to [facility] [phone] 

## 2022-11-02 NOTE — Progress Notes (Signed)
[facility]  PATIENT: Amber Hawkins DOB: Nov 28, 1955  REFERRING CLINICIAN: Orlene Plum, NP HISTORY FROM: patient  REASON FOR VISIT: new consult   HISTORICAL  CHIEF COMPLAINT:  Chief Complaint  Patient presents with   New Patient (Initial Visit)    Pt with ,rm 7. Over the last 6-10 months she was having some difficulties with remembering short term concerns. She has started having also balance concerns as well. She has been having increase in falls    HISTORY OF PRESENT ILLNESS:   67 year old female here for evaluation of memory loss and gait difficulty.  Symptom started about 1 year ago with progressive short-term memory loss, gait and balance difficulty, falls and confusion.  Typically having issues with short-term memory loss.  Having more trouble maintaining ADLs especially outside of the home.  Needs more help and supervision from husband.  Has a walker at home but declines to use it.   REVIEW OF SYSTEMS: Full 14 system review of systems performed and negative with exception of: as per HPI  ALLERGIES: Allergies  Allergen Reactions   Baclofen Nausea And Vomiting    HOME MEDICATIONS: Outpatient Medications Prior to Visit  Medication Sig Dispense Refill   aspirin 325 MG tablet Take 1 tablet (325 mg total) by mouth daily.     atorvastatin (LIPITOR) 40 MG tablet Take 40 mg by mouth daily.     b complex vitamins capsule Take 1 capsule by mouth daily.     BD PEN NEEDLE NANO 2ND GEN 32G X 4 MM MISC USE TWICE DAILY AS DIRECTED 90     Black Cohosh 540 MG CAPS Take 1 capsule by mouth daily.     canagliflozin (INVOKANA) 100 MG TABS tablet Take 100 mg by mouth daily.     carvedilol (COREG) 3.125 MG tablet Take 3.125 mg by mouth 2 (two) times daily with a meal.     clonazePAM (KLONOPIN) 1 MG tablet Take 1 mg by mouth 3 (three) times daily as needed for anxiety.     clopidogrel (PLAVIX) 75 MG tablet Take 75 mg by mouth daily.      clotrimazole-betamethasone (LOTRISONE) cream Apply topically as needed.     Continuous Blood Gluc Receiver (DEXCOM G6 RECEIVER) DEVI Inject into the skin daily.     Continuous Blood Gluc Sensor (DEXCOM G6 SENSOR) MISC SMARTSIG:1 Each Topical Every 10 Days     Continuous Blood Gluc Transmit (DEXCOM G6 TRANSMITTER) MISC Inject into the skin daily.     dicyclomine (BENTYL) 10 MG capsule TAKE 1 CAPSULE (10 MG TOTAL) BY MOUTH EVERY 8 (EIGHT) HOURS AS NEEDED FOR SPASMS (ABDOMINAL PAIN). 270 capsule 0   DULoxetine (CYMBALTA) 20 MG capsule Take 20 mg by mouth daily.     ergocalciferol (VITAMIN D2) 1.25 MG (50000 UT) capsule Take 50,000 Units by mouth once a week. Takes one tablet twice weekly     fenofibrate (TRICOR) 145 MG tablet Take 145 mg by mouth daily.     folic acid (FOLVITE) 800 MCG tablet Take 400 mcg by mouth daily.     gabapentin (NEURONTIN) 300 MG capsule Take 300 mg by mouth 3 (three) times daily.     glucose blood (ACCU-CHEK GUIDE) test strip USE TO CHECK SUGAR 4 TIMES DAILY     hydrochlorothiazide (HYDRODIURIL) 25 MG tablet Take 25 mg by mouth daily.     Ibuprofen 200 MG CAPS Take 400 mg by mouth 2 (two) times daily as needed.      insulin aspart (  NOVOLOG FLEXPEN) 100 UNIT/ML FlexPen Inject 1-20 Units into the skin 3 (three) times daily as needed. Patient uses a carb ratio 1:10 units per meal or 4-6 units with food add correction of 2 units per 50 above 200 pt uses up to 20 units daily     Insulin Glargine (LANTUS SOLOSTAR) 100 UNIT/ML Solostar Pen Inject 5 Units into the skin daily as needed.     lansoprazole (PREVACID) 30 MG capsule Take 30 mg by mouth daily.     levothyroxine (SYNTHROID) 125 MCG tablet Take 125 mcg by mouth every morning.     lisinopril (ZESTRIL) 2.5 MG tablet 1 tablet Orally Once a day for 30 days     melatonin 3 MG TABS tablet 1 tablet at bedtime as needed Orally Once a day     memantine (NAMENDA) 5 MG tablet Take 10 mg by mouth 2 (two) times daily.     metFORMIN  (GLUCOPHAGE) 500 MG tablet Take 500 mg by mouth 2 (two) times daily with a meal.     Omega-3 Fatty Acids (FISH OIL) 1000 MG CAPS Take 1 capsule by mouth daily.     Probiotic Product (ALIGN) 4 MG CAPS Take by mouth daily.     SUMAtriptan (IMITREX) 100 MG tablet Take 1 tablet by mouth as needed.     venlafaxine XR (EFFEXOR-XR) 150 MG 24 hr capsule Take 150 mg by mouth daily.     Vitamin D, Ergocalciferol, (DRISDOL) 1.25 MG (50000 UNIT) CAPS capsule Take 50,000 Units by mouth every 7 (seven) days. Two times per week.     No facility-administered medications prior to visit.    PAST MEDICAL HISTORY: Past Medical History:  Diagnosis Date   Anxiety    CVA (cerebral vascular accident) (HCC)    right sided weakness, with ling term memory loss.   Diabetes mellitus type II, controlled (HCC)    Fibromyalgia    GERD (gastroesophageal reflux disease)    Hypercholesteremia    Hypertension    Hypothyroidism    Neuropathy     PAST SURGICAL HISTORY: Past Surgical History:  Procedure Laterality Date   ABDOMINAL HYSTERECTOMY     APPENDECTOMY     BIOPSY  10/20/2018   Procedure: BIOPSY;  Surgeon: Malissa Hippo, MD;  [facility];  Service: Endoscopy;;  gastric   BIOPSY  10/03/2020   Procedure: BIOPSY;  Surgeon: Dolores Frame, MD;  [facility];  Service: Gastroenterology;;   CHOLECYSTECTOMY     COLONOSCOPY WITH PROPOFOL N/A 10/20/2018   Procedure: COLONOSCOPY WITH PROPOFOL;  Surgeon: Malissa Hippo, MD;  [facility];  Service: Endoscopy;  Laterality: N/A;   COLONOSCOPY WITH PROPOFOL N/A 10/03/2020   Procedure: COLONOSCOPY WITH PROPOFOL;  Surgeon: Dolores Frame, MD;  [facility];  Service: Gastroenterology;  Laterality: N/A;  10:55   ESOPHAGOGASTRODUODENOSCOPY (EGD) WITH PROPOFOL N/A 10/20/2018   Procedure: ESOPHAGOGASTRODUODENOSCOPY (EGD) WITH PROPOFOL;  Surgeon: Malissa Hippo, MD;  [facility];  Service:  Endoscopy;  Laterality: N/A;  1220   POLYPECTOMY  10/20/2018   Procedure: POLYPECTOMY;  Surgeon: Malissa Hippo, MD;  [facility];  Service: Endoscopy;;  colon    POLYPECTOMY  10/03/2020   Procedure: POLYPECTOMY INTESTINAL;  Surgeon: Dolores Frame, MD;  [facility];  Service: Gastroenterology;;   TONSILLECTOMY     TUBAL LIGATION      FAMILY HISTORY: Family History  Problem Relation Age of Onset   Colon  cancer Mother    COPD Mother    Microcephaly Father    Heart attack Father    Breast cancer Maternal Aunt    Colon cancer Maternal Grandfather    Colon cancer Other     SOCIAL HISTORY: Social History   Socioeconomic History   Marital status: Married    Spouse name: Fayrene Fearing    Number of children: 1   Years of education: Not on file   Highest education level: Not on file  Occupational History   Occupation: unemployed  Tobacco Use   Smoking status: Every Day    Current packs/day: 0.00    Average packs/day: 0.3 packs/day for 32.6 years (8.2 ttl pk-yrs)    Types: Cigarettes    Start date: 03/08/1978    Last attempt to quit: 10/18/2010    Years since quitting: 12.0   Smokeless tobacco: Never  Vaping Use   Vaping status: Never Used  Substance and Sexual Activity   Alcohol use: Not Currently   Drug use: Never   Sexual activity: Not Currently    Birth control/protection: Surgical  Other Topics Concern   Not on file  Social History Narrative   Not on file   Social Determinants of Health   Financial Resource Strain: Not on file  Food Insecurity: Not on file  Transportation Needs: Not on file  Physical Activity: Not on file  Stress: Not on file  Social Connections: Not on file  Intimate Partner Violence: Not on file     PHYSICAL EXAM   GENERAL EXAM/CONSTITUTIONAL: Vitals:  Vitals:   11/02/22 1052  BP: 92/64  Pulse: 94  Weight: 149 lb (67.6 kg)  Height: 5\' 4"  (1.626 m)   Body mass index is 25.58 kg/m. Wt Readings from  Last 3 Encounters:  11/02/22 149 lb (67.6 kg)  10/18/22 148 lb 14.4 oz (67.5 kg)  06/01/22 152 lb (68.9 kg)   Patient is in no distress; well developed, nourished and groomed; neck is supple  CARDIOVASCULAR: Examination of carotid arteries is normal; no carotid bruits Regular rate and rhythm, no murmurs Examination of peripheral vascular system by observation and palpation is normal  EYES: Ophthalmoscopic exam of optic discs and posterior segments is normal; no papilledema or hemorrhages No results found.  MUSCULOSKELETAL: Gait, strength, tone, movements noted in Neurologic exam below  NEUROLOGIC: MENTAL STATUS:     11/02/2022   10:54 AM  MMSE - Mini Mental State Exam  Orientation to time 2  Orientation to Place 3  Registration 3  Attention/ Calculation 0  Recall 0  Language- name 2 objects 2  Language- repeat 1  Language- follow 3 step command 3  Language- read & follow direction 1  Write a sentence 1  Copy design 0  Total score 16   awake, alert, oriented to person DECR MEMORY DECR attention and concentration DECR FLUENCY, comprehension intact, naming intact fund of knowledge appropriate  CRANIAL NERVE:  2nd - no papilledema on fundoscopic exam 2nd, 3rd, 4th, 6th - pupils equal and reactive to light, visual fields full to confrontation, extraocular muscles intact, no nystagmus 5th - facial sensation symmetric 7th - facial strength symmetric 8th - hearing intact 9th - palate elevates symmetrically, uvula midline 11th - shoulder shrug symmetric 12th - tongue protrusion midline  MOTOR:  normal bulk and tone, full strength in the BUE, BLE  SENSORY:  normal and symmetric to light touch, temperature, vibration  COORDINATION:  finger-nose-finger, fine finger movements normal  REFLEXES:  deep tendon reflexes 1+  and symmetric  GAIT/STATION:  WIDE GAIT; UNSTEADY     DIAGNOSTIC DATA (LABS, IMAGING, TESTING) - I reviewed patient records, labs, notes,  testing and imaging myself where available.  Lab Results  Component Value Date   WBC 10.9 (H) 05/10/2022   HGB 14.8 05/10/2022   HCT 44.6 05/10/2022   MCV 91.0 05/10/2022   PLT 406 (H) 05/10/2022      Component Value Date/Time   NA 138 05/04/2021 1536   K 4.3 05/04/2021 1536   CL 106 05/04/2021 1536   CO2 22 05/04/2021 1536   GLUCOSE 137 (H) 05/04/2021 1536   BUN 12 05/04/2021 1536   CREATININE 0.76 05/04/2021 1536   CALCIUM 10.1 05/04/2021 1536   PROT 7.3 05/04/2021 1536   ALBUMIN 4.1 05/04/2021 1536   AST 17 05/04/2021 1536   ALT 11 05/04/2021 1536   ALKPHOS 68 05/04/2021 1536   BILITOT 0.6 05/04/2021 1536   GFRNONAA >60 05/04/2021 1536   GFRAA >60 04/25/2019 1208   No results found for: "CHOL", "HDL", "LDLCALC", "LDLDIRECT", "TRIG", "CHOLHDL" Lab Results  Component Value Date   HGBA1C 6.4 (H) 10/18/2018   No results found for: "VITAMINB12" Lab Results  Component Value Date   TSH 6.61 (H) 01/25/2008    12/17/19 CT head  1. No definite acute intracranial abnormality.  2. Chronically advanced white matter disease, and increased but  chronic appearing encephalomalacia in the Left occipital pole/PCA  territory.    ASSESSMENT AND PLAN  67 y.o. year old female here with:   Dx:  1. Memory loss   2. Gait difficulty   3. Moderate dementia without behavioral disturbance, psychotic disturbance, mood disturbance, or anxiety, unspecified dementia type (HCC)   4. Alzheimer's disease, unspecified (CODE) (HCC)     PLAN:  MEMORY LOSS / GAIT DIFF / FATIGUE (mmse 16/30) - check MRI brain - check labs - continue memantine; may increase up to 10mg  twice a day - safety / supervision issues reviewed - daily physical activity / exercise (at least 15-30 minutes) - eat more plants / vegetables - increase social activities, brain stimulation, games, puzzles, hobbies, crafts, arts, music - aim for at least 7-8 hours sleep per night (or more) - avoid smoking and alcohol -  caution with medications, finances; no driving - use walker; consider PT   Orders Placed This Encounter  Procedures   MR BRAIN WO CONTRAST   ATN PROFILE   Vitamin B12   TSH Rfx on Abnormal to Free T4   Return for pending test results, pending if symptoms worsen or fail to improve.    Suanne Marker, MD 11/02/2022, 11:34 AM Certified in Neurology, Neurophysiology and Neuroimaging  [facility] [address] [place], [place] [address] [phone]

## 2022-11-03 LAB — TSH RFX ON ABNORMAL TO FREE T4: TSH: 1.31 u[IU]/mL (ref 0.450–4.500)

## 2022-11-05 LAB — ATN PROFILE
A -- Beta-amyloid 42/40 Ratio: 0.112 (ref >0.102)
Beta-amyloid 40: 270.17 pg/mL
Beta-amyloid 42: 30.14 pg/mL
N -- NfL, Plasma: 6.63 pg/mL — ABNORMAL HIGH (ref 0.00–4.61)
T -- p-tau181: 1.1 pg/mL — ABNORMAL HIGH (ref 0.00–0.97)

## 2022-11-05 LAB — VITAMIN B12: Vitamin B-12: 1468 pg/mL — ABNORMAL HIGH (ref 232–1245)

## 2022-11-17 ENCOUNTER — Telehealth: Payer: Self-pay | Admitting: Neurology

## 2022-11-17 NOTE — Telephone Encounter (Signed)
-----   Message from [facility] sent at 11/11/2022 11:34 AM EDT ----- Abnl tau and nfl levels, may indicate neurodegeneration / dementia process, but not otherwise pinpointed. Continue current plan. -VRP

## 2022-11-17 NOTE — Telephone Encounter (Signed)
Called the patient. There was no answer. No DPR on file.  LVM asking for the patient to call back.   **If pt returns call, please advise that Dr Marjory Lies reviewed the blood work that was completed that looks to see if she carries certain biomarkers for Alzhiemers.  The lab was abnormal in some of the findings but the specific lab we look at in relation to alzhiemer's was in normal range.  Dr Marjory Lies states with this finding with the abnormal Tau result, this may indicate some decline in memory over time but not able to specific correlate with Alzheimer's. We would continue to monitor her symptoms and treat the symptoms as needed.

## 2022-11-18 NOTE — Telephone Encounter (Signed)
REQUIRED PHONE NOTE: pt called back in response to call from yesterday. The message from [place], [place] was relayed.This is FYI to POD 3, no call back requested

## 2022-11-25 ENCOUNTER — Ambulatory Visit
Admission: RE | Admit: 2022-11-25 | Discharge: 2022-11-25 | Disposition: A | Discharge status: Home / Self Care | Payer: Medicare Other | Source: Ambulatory Visit | Attending: Diagnostic Neuroimaging | Admitting: Diagnostic Neuroimaging

## 2022-11-25 DIAGNOSIS — F03B0 Unspecified dementia, moderate, without behavioral disturbance, psychotic disturbance, mood disturbance, and anxiety: Secondary | ICD-10-CM

## 2022-11-25 DIAGNOSIS — R269 Unspecified abnormalities of gait and mobility: Secondary | ICD-10-CM

## 2022-11-25 DIAGNOSIS — G309 Alzheimer's disease, unspecified: Secondary | ICD-10-CM

## 2022-11-25 DIAGNOSIS — R413 Other amnesia: Secondary | ICD-10-CM | POA: Diagnosis not present

## 2022-12-03 ENCOUNTER — Telehealth: Payer: Self-pay

## 2022-12-03 NOTE — Telephone Encounter (Signed)
Called patient to inform her per Dr Marjory Lies "Scattered chronic small vessel ischemic disease.  Continue medical management.  No other major abnormalities related to memory loss". Patient phone answered with a loud noise and wasn't able to LVM. Will try to call patient back on MONDAY.

## 2022-12-03 NOTE — Telephone Encounter (Signed)
-----   Message from Suanne Marker sent at 12/03/2022 10:38 AM EDT ----- Scattered chronic small vessel ischemic disease.  Continue medical management.  No other major abnormalities related to memory loss. -VRP

## 2022-12-14 ENCOUNTER — Telehealth: Payer: Self-pay

## 2022-12-14 NOTE — Telephone Encounter (Signed)
-----   Message from Suanne Marker sent at 12/03/2022 10:38 AM EDT ----- Scattered chronic small vessel ischemic disease.  Continue medical management.  No other major abnormalities related to memory loss. -VRP

## 2022-12-14 NOTE — Telephone Encounter (Signed)
Please see other phone message  

## 2022-12-14 NOTE — Telephone Encounter (Signed)
Called patient and LVM to give [place] a call back for results.

## 2022-12-14 NOTE — Telephone Encounter (Signed)
Returned patient call and informed the patient and her Husband of her results. Pt verbalized understanding. Pt had no questions at this time but was encouraged to call back if questions arise.

## 2022-12-14 NOTE — Telephone Encounter (Signed)
Please return the call to pt when available

## 2023-03-24 ENCOUNTER — Other Ambulatory Visit: Payer: Self-pay

## 2023-03-24 DIAGNOSIS — Z122 Encounter for screening for malignant neoplasm of respiratory organs: Secondary | ICD-10-CM

## 2023-03-24 DIAGNOSIS — Z87891 Personal history of nicotine dependence: Secondary | ICD-10-CM

## 2023-05-03 ENCOUNTER — Other Ambulatory Visit: Payer: Self-pay | Admitting: Internal Medicine

## 2023-05-03 DIAGNOSIS — Z1231 Encounter for screening mammogram for malignant neoplasm of breast: Secondary | ICD-10-CM

## 2023-05-10 ENCOUNTER — Inpatient Hospital Stay: Admission: RE | Admit: 2023-05-10 | Payer: Medicare Other | Source: Ambulatory Visit

## 2023-05-17 ENCOUNTER — Ambulatory Visit (HOSPITAL_COMMUNITY)
Admission: RE | Admit: 2023-05-17 | Discharge: 2023-05-17 | Disposition: A | Discharge status: Home / Self Care | Payer: Medicare Other | Source: Ambulatory Visit | Attending: Oncology | Admitting: Oncology

## 2023-05-17 DIAGNOSIS — Z122 Encounter for screening for malignant neoplasm of respiratory organs: Secondary | ICD-10-CM | POA: Diagnosis present

## 2023-05-17 DIAGNOSIS — Z87891 Personal history of nicotine dependence: Secondary | ICD-10-CM | POA: Insufficient documentation

## 2023-06-07 ENCOUNTER — Ambulatory Visit
Admission: RE | Admit: 2023-06-07 | Discharge: 2023-06-07 | Disposition: A | Discharge status: Home / Self Care | Source: Ambulatory Visit | Attending: Internal Medicine | Admitting: Internal Medicine

## 2023-06-07 DIAGNOSIS — Z1231 Encounter for screening mammogram for malignant neoplasm of breast: Secondary | ICD-10-CM

## 2023-06-13 NOTE — Progress Notes (Signed)
Patient notified of LDCT Lung Cancer Screening Results via mail with the recommendation to follow-up in 12 months. Patient's referring provider has been sent a copy of results. Results are as follows:   IMPRESSION: 1. Lung-RADS 2, benign appearance or behavior. Continue annual screening with low-dose chest CT without contrast in 12 months. 2. Three-vessel coronary atherosclerosis. 3. Aortic Atherosclerosis (ICD10-I70.0) and Emphysema (ICD10-J43.9). 

## 2023-10-30 IMAGING — CT CT CHEST LUNG CANCER SCREENING LOW DOSE W/O CM
1 series · 10 of 10 positions shown, 13 images · non-contrast
Comparison: 05/08/2019

CLINICAL DATA: Lung cancer screening. Thirty-six pack-year history.
Current asymptomatic smoker.



[ct lung segmentation data · axial · 0.62mm/px · z∈[+1885,+1885]mm · 10 of 288 frames shown]
[frame 1/288  mediastinal]
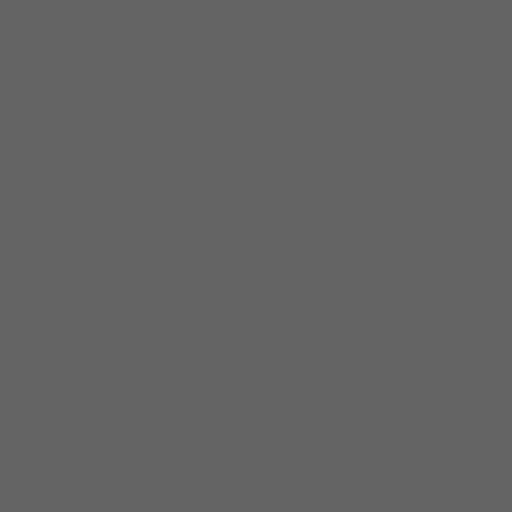
[frame 1/288  lung]
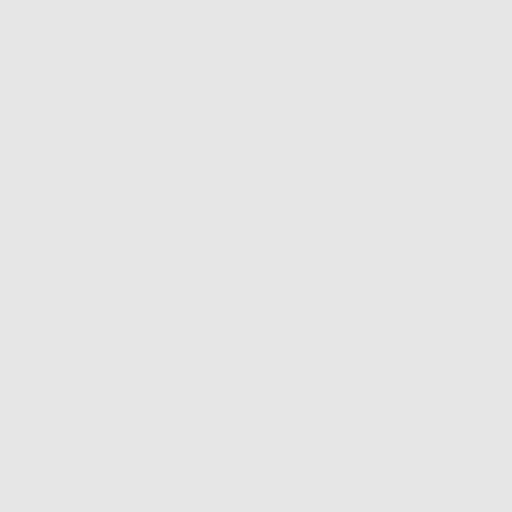
[frame 32/288  lung]
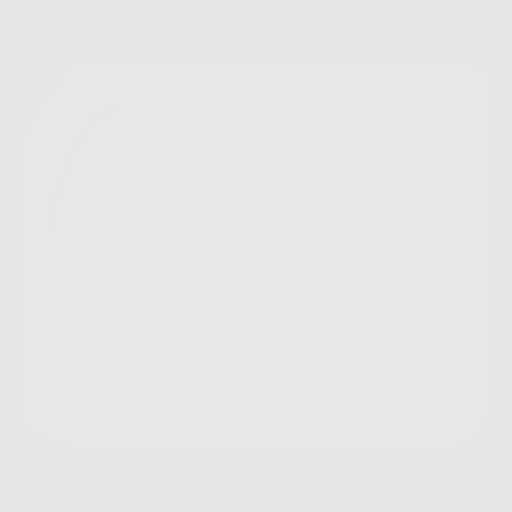
[frame 64/288  lung]
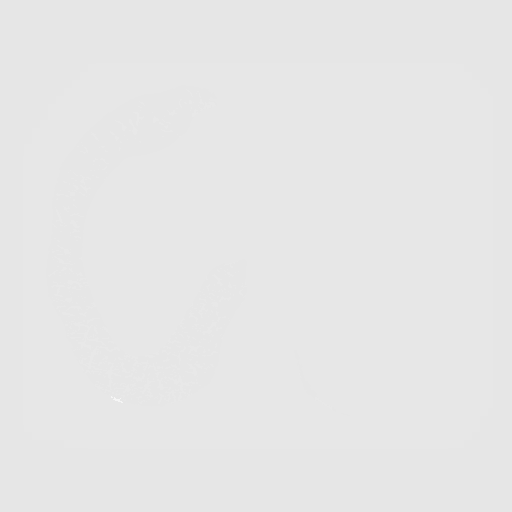
[frame 96/288  lung]
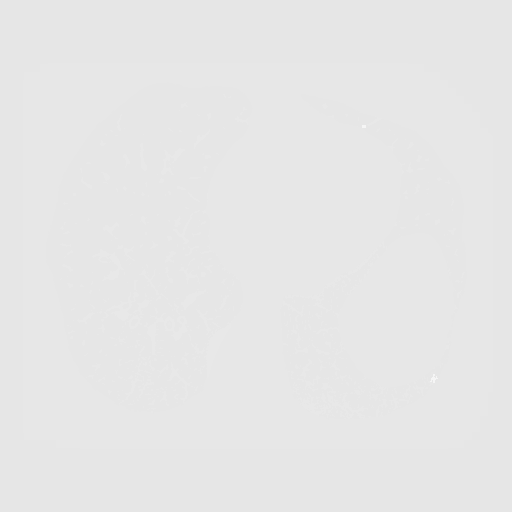
[frame 128/288  mediastinal]
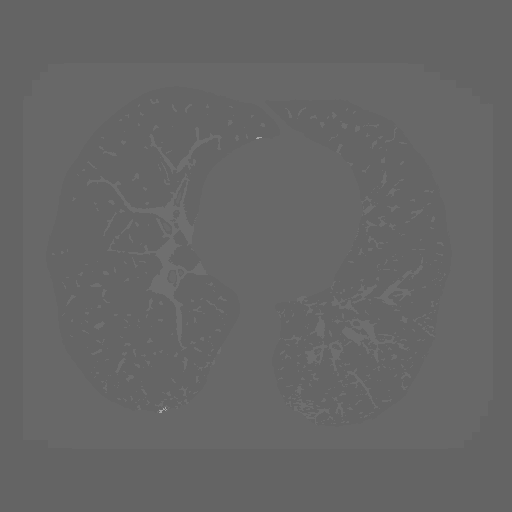
[frame 128/288  lung]
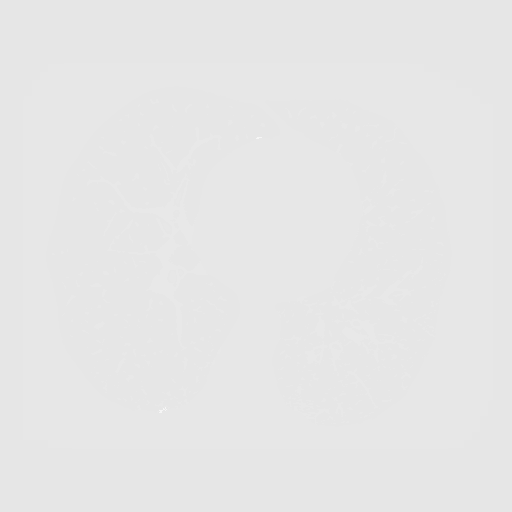
[frame 160/288  lung]
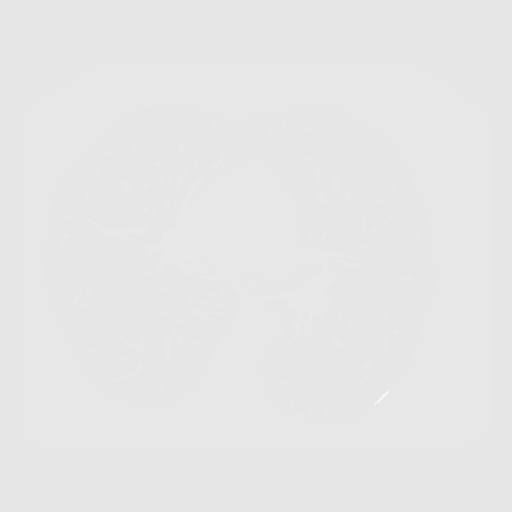
[frame 192/288  lung]
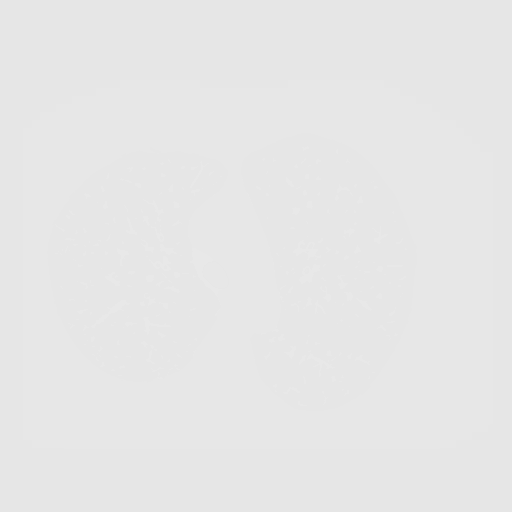
[frame 224/288  lung]
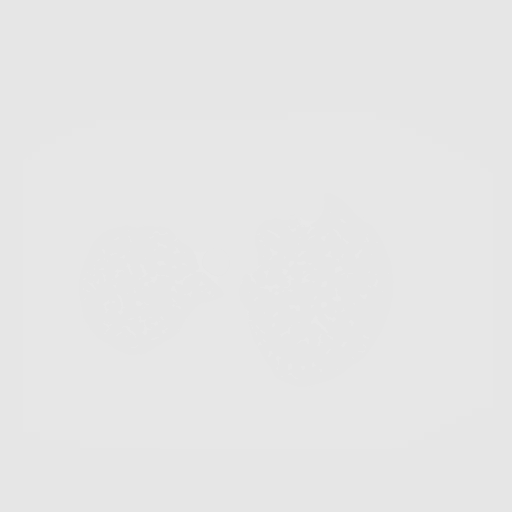
[frame 256/288  mediastinal]
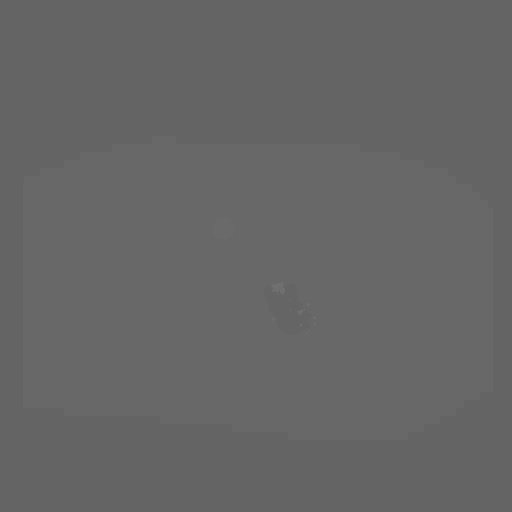
[frame 256/288  lung]
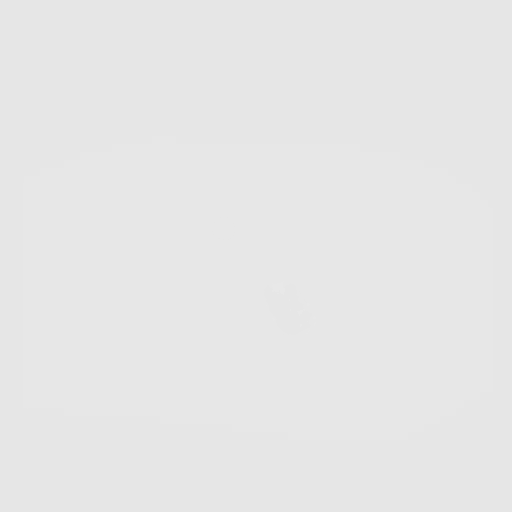
[frame 288/288  lung]
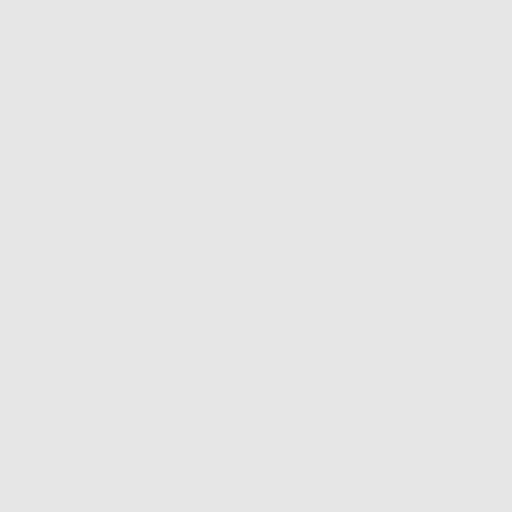

[10 of 10 positions shown; findings below may reference images not displayed]

FINDINGS: Cardiovascular: Heart size appears within normal limits. Aortic
atherosclerosis. Coronary artery calcifications. No pericardial
effusion. Loop recorder is implanted in the left chest wall.

Mediastinum/Nodes: No enlarged mediastinal, hilar, or axillary lymph
nodes. Thyroid gland, trachea, and esophagus demonstrate no
significant findings.

Lungs/Pleura: Mild centrilobular and paraseptal emphysema. No
pleural effusion, airspace consolidation, or atelectasis. Diffuse
bronchial wall thickening noted. Small pulmonary nodules are
unchanged compared with the previous exam. The largest nodule is in
the posteromedial right upper lobe appearing non solid with a mean
derived diameter of 7.5 mm. These nodules are either stable or
decreased in size from previous exam. No new lung nodules.

Upper Abdomen: No acute abnormality.

Musculoskeletal: No acute findings. Mild superior endplate deformity
is identified involving the T10 vertebra, new from previous exam.
IMPRESSION: 1. Lung-RADS 2, benign appearance or behavior. Continue annual
screening with low-dose chest CT without contrast in 12 months.
2. Coronary artery calcifications.
3. Mild superior endplate deformity involving the T10 vertebra, new
from previous exam.
4. Aortic Atherosclerosis (2HJ4P-J87.7) and Emphysema (2HJ4P-9YG.Q).

## 2024-01-11 ENCOUNTER — Encounter (INDEPENDENT_AMBULATORY_CARE_PROVIDER_SITE_OTHER): Payer: Self-pay | Admitting: Gastroenterology

## 2024-03-22 ENCOUNTER — Encounter: Payer: Self-pay | Admitting: *Deleted

## 2024-03-22 ENCOUNTER — Other Ambulatory Visit: Payer: Self-pay | Admitting: *Deleted

## 2024-03-22 DIAGNOSIS — Z122 Encounter for screening for malignant neoplasm of respiratory organs: Secondary | ICD-10-CM

## 2024-03-22 DIAGNOSIS — Z87891 Personal history of nicotine dependence: Secondary | ICD-10-CM

## 2024-03-22 DIAGNOSIS — F17210 Nicotine dependence, cigarettes, uncomplicated: Secondary | ICD-10-CM

## 2024-05-17 ENCOUNTER — Ambulatory Visit (HOSPITAL_COMMUNITY)
